# Patient Record
Sex: Female | Born: 1995
Health system: Southern US, Community
[De-identification: ages and names within clinical notes are randomized; demographics above are authoritative.]

## PROBLEM LIST (undated history)

## (undated) DIAGNOSIS — F319 Bipolar disorder, unspecified: Secondary | ICD-10-CM

## (undated) DIAGNOSIS — K5190 Ulcerative colitis, unspecified, without complications: Secondary | ICD-10-CM

## (undated) DIAGNOSIS — G43909 Migraine, unspecified, not intractable, without status migrainosus: Secondary | ICD-10-CM

## (undated) DIAGNOSIS — F419 Anxiety disorder, unspecified: Secondary | ICD-10-CM

## (undated) HISTORY — DX: Ulcerative colitis, unspecified, without complications: K51.90

## (undated) HISTORY — DX: Bipolar disorder, unspecified: F31.9

## (undated) HISTORY — DX: Anxiety disorder, unspecified: F41.9

---
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
## 2013-02-24 ENCOUNTER — Emergency Department (HOSPITAL_BASED_OUTPATIENT_CLINIC_OR_DEPARTMENT_OTHER): Payer: Managed Care, Other (non HMO)

## 2013-02-24 ENCOUNTER — Encounter (HOSPITAL_BASED_OUTPATIENT_CLINIC_OR_DEPARTMENT_OTHER): Payer: Self-pay

## 2013-02-24 ENCOUNTER — Emergency Department (HOSPITAL_BASED_OUTPATIENT_CLINIC_OR_DEPARTMENT_OTHER)
Admission: EM | Admit: 2013-02-24 | Discharge: 2013-02-25 | Disposition: A | Discharge status: Home / Self Care | Payer: Managed Care, Other (non HMO) | Attending: Emergency Medicine | Admitting: Emergency Medicine

## 2013-02-24 DIAGNOSIS — R5381 Other malaise: Secondary | ICD-10-CM | POA: Insufficient documentation

## 2013-02-24 DIAGNOSIS — F319 Bipolar disorder, unspecified: Secondary | ICD-10-CM | POA: Insufficient documentation

## 2013-02-24 DIAGNOSIS — Z87891 Personal history of nicotine dependence: Secondary | ICD-10-CM | POA: Insufficient documentation

## 2013-02-24 DIAGNOSIS — Z79899 Other long term (current) drug therapy: Secondary | ICD-10-CM | POA: Insufficient documentation

## 2013-02-24 DIAGNOSIS — F411 Generalized anxiety disorder: Secondary | ICD-10-CM | POA: Insufficient documentation

## 2013-02-24 DIAGNOSIS — R112 Nausea with vomiting, unspecified: Secondary | ICD-10-CM | POA: Insufficient documentation

## 2013-02-24 DIAGNOSIS — R509 Fever, unspecified: Secondary | ICD-10-CM | POA: Insufficient documentation

## 2013-02-24 DIAGNOSIS — Z8719 Personal history of other diseases of the digestive system: Secondary | ICD-10-CM | POA: Insufficient documentation

## 2013-02-24 DIAGNOSIS — Z3202 Encounter for pregnancy test, result negative: Secondary | ICD-10-CM | POA: Insufficient documentation

## 2013-02-24 DIAGNOSIS — R109 Unspecified abdominal pain: Secondary | ICD-10-CM | POA: Insufficient documentation

## 2013-02-24 LAB — CBC WITH DIFFERENTIAL/PLATELET
Basophils Absolute: 0 10*3/uL (ref 0.0–0.1)
Basophils Relative: 0 % (ref 0–1)
Eosinophils Absolute: 0 10*3/uL (ref 0.0–0.7)
MCH: 31 pg (ref 26.0–34.0)
MCHC: 34.3 g/dL (ref 30.0–36.0)
Monocytes Relative: 6 % (ref 3–12)
Neutro Abs: 5.9 10*3/uL (ref 1.7–7.7)
Neutrophils Relative %: 71 % (ref 43–77)
Platelets: 240 10*3/uL (ref 150–400)
RDW: 14.9 % (ref 11.5–15.5)

## 2013-02-24 LAB — URINE MICROSCOPIC-ADD ON

## 2013-02-24 LAB — COMPREHENSIVE METABOLIC PANEL
AST: 20 U/L (ref 0–37)
Albumin: 4.3 g/dL (ref 3.5–5.2)
Alkaline Phosphatase: 41 U/L (ref 39–117)
BUN: 13 mg/dL (ref 6–23)
Chloride: 104 mEq/L (ref 96–112)
Potassium: 3.6 mEq/L (ref 3.5–5.1)
Total Protein: 7.3 g/dL (ref 6.0–8.3)

## 2013-02-24 LAB — URINALYSIS, ROUTINE W REFLEX MICROSCOPIC
Bilirubin Urine: NEGATIVE
Glucose, UA: NEGATIVE mg/dL
Nitrite: NEGATIVE
Specific Gravity, Urine: 1.024 (ref 1.005–1.030)
pH: 7 (ref 5.0–8.0)

## 2013-02-24 LAB — LIPASE, BLOOD: Lipase: 41 U/L (ref 11–59)

## 2013-02-24 MED ORDER — SODIUM CHLORIDE 0.9 % IV SOLN
Freq: Once | INTRAVENOUS | Status: AC
  Administered 2013-02-24: 23:00:00 via INTRAVENOUS

## 2013-02-24 MED ORDER — IOHEXOL 300 MG/ML  SOLN
100.0000 mL | Freq: Once | INTRAMUSCULAR | Status: AC | PRN
  Administered 2013-02-24: 100 mL via INTRAVENOUS

## 2013-02-24 MED ORDER — HYDROMORPHONE HCL PF 1 MG/ML IJ SOLN
1.0000 mg | Freq: Once | INTRAMUSCULAR | Status: AC
  Administered 2013-02-24: 1 mg via INTRAVENOUS
  Filled 2013-02-24: qty 1

## 2013-02-24 MED ORDER — IOHEXOL 300 MG/ML  SOLN
50.0000 mL | Freq: Once | INTRAMUSCULAR | Status: AC | PRN
  Administered 2013-02-24: 50 mL via ORAL

## 2013-02-24 MED ORDER — MORPHINE SULFATE 4 MG/ML IJ SOLN
4.0000 mg | Freq: Once | INTRAMUSCULAR | Status: AC
  Administered 2013-02-24: 4 mg via INTRAVENOUS
  Filled 2013-02-24: qty 1

## 2013-02-24 MED ORDER — SODIUM CHLORIDE 0.9 % IV BOLUS (SEPSIS)
1000.0000 mL | Freq: Once | INTRAVENOUS | Status: AC
  Administered 2013-02-24: 1000 mL via INTRAVENOUS

## 2013-02-24 MED ORDER — ONDANSETRON HCL 4 MG/2ML IJ SOLN
4.0000 mg | INTRAMUSCULAR | Status: AC
  Administered 2013-02-24: 4 mg via INTRAVENOUS
  Filled 2013-02-24: qty 2

## 2013-02-24 MED ORDER — PROMETHAZINE HCL 25 MG/ML IJ SOLN
12.5000 mg | Freq: Once | INTRAMUSCULAR | Status: AC
  Administered 2013-02-24: 12.5 mg via INTRAVENOUS
  Filled 2013-02-24: qty 1

## 2013-02-24 NOTE — ED Provider Notes (Addendum)
Medical screening examination/treatment/procedure(s) were conducted as a shared visit with non-physician practitioner(s) and myself.  I personally evaluated the patient during the encounter Pt with hx of UC, recent colonscopy with worsening abd pain, vomiting.  Awaiting CT results.  Results for orders placed during the hospital encounter of 02/24/13  CBC WITH DIFFERENTIAL      Result Value Range   WBC 8.3  4.0 - 10.5 K/uL   RBC 4.81  3.87 - 5.11 MIL/uL   Hemoglobin 14.9  12.0 - 15.0 g/dL   HCT 43.4  36.0 - 46.0 %   MCV 90.2  78.0 - 100.0 fL   MCH 31.0  26.0 - 34.0 pg   MCHC 34.3  30.0 - 36.0 g/dL   RDW 14.9  11.5 - 15.5 %   Platelets 240  150 - 400 K/uL   Neutrophils Relative % 71  43 - 77 %   Neutro Abs 5.9  1.7 - 7.7 K/uL   Lymphocytes Relative 23  12 - 46 %   Lymphs Abs 1.9  0.7 - 4.0 K/uL   Monocytes Relative 6  3 - 12 %   Monocytes Absolute 0.5  0.1 - 1.0 K/uL   Eosinophils Relative 0  0 - 5 %   Eosinophils Absolute 0.0  0.0 - 0.7 K/uL   Basophils Relative 0  0 - 1 %   Basophils Absolute 0.0  0.0 - 0.1 K/uL  COMPREHENSIVE METABOLIC PANEL      Result Value Range   Sodium 141  135 - 145 mEq/L   Potassium 3.6  3.5 - 5.1 mEq/L   Chloride 104  96 - 112 mEq/L   CO2 25  19 - 32 mEq/L   Glucose, Bld 99  70 - 99 mg/dL   BUN 13  6 - 23 mg/dL   Creatinine, Ser 0.60  0.50 - 1.10 mg/dL   Calcium 10.1  8.4 - 10.5 mg/dL   Total Protein 7.3  6.0 - 8.3 g/dL   Albumin 4.3  3.5 - 5.2 g/dL   AST 20  0 - 37 U/L   ALT 10  0 - 35 U/L   Alkaline Phosphatase 41  39 - 117 U/L   Total Bilirubin 0.3  0.3 - 1.2 mg/dL   GFR calc non Af Amer >90  >90 mL/min   GFR calc Af Amer >90  >90 mL/min  LIPASE, BLOOD      Result Value Range   Lipase 41  11 - 59 U/L  URINALYSIS, ROUTINE W REFLEX MICROSCOPIC      Result Value Range   Color, Urine YELLOW  YELLOW   APPearance CLOUDY (*) CLEAR   Specific Gravity, Urine 1.024  1.005 - 1.030   pH 7.0  5.0 - 8.0   Glucose, UA NEGATIVE  NEGATIVE mg/dL   Hgb  urine dipstick NEGATIVE  NEGATIVE   Bilirubin Urine NEGATIVE  NEGATIVE   Ketones, ur 15 (*) NEGATIVE mg/dL   Protein, ur NEGATIVE  NEGATIVE mg/dL   Urobilinogen, UA 1.0  0.0 - 1.0 mg/dL   Nitrite NEGATIVE  NEGATIVE   Leukocytes, UA SMALL (*) NEGATIVE  PREGNANCY, URINE      Result Value Range   Preg Test, Ur NEGATIVE  NEGATIVE  URINE MICROSCOPIC-ADD ON      Result Value Range   Squamous Epithelial / LPF FEW (*) RARE   WBC, UA 3-6  <3 WBC/hpf   Bacteria, UA RARE  RARE   Ct Abdomen Pelvis W Contrast  02/25/2013   *  RADIOLOGY REPORT*  Clinical Data: 17 year old female with fever abdominal pain and vomiting.  Recent colonoscopy for ulcerative colitis.  CT ABDOMEN AND PELVIS WITH CONTRAST  Technique:  Multidetector CT imaging of the abdomen and pelvis was performed following the standard protocol during bolus administration of intravenous contrast.  Contrast: 151m OMNIPAQUE IOHEXOL 300 MG/ML  SOLN  Comparison: CT abdomen and pelvis 05/23/2012 and earlier.  Findings: Negative lung bases.  No pericardial or pleural effusion. No acute osseous abnormality identified.  Trace pelvic free fluid on the right.  Decompressed and negative distal colon.  Uterus and adnexa within normal limits.  The bladder is decompressed.  Negative left colon and splenic flexure.  Negative transverse colon.  Likewise the right colon and appendix appear normal. Terminal ileum within normal limits.  No dilated or abnormal small bowel identified.  Negative stomach and duodenum.  Negative liver, gallbladder, spleen, pancreas, adrenal glands, and kidneys.  Normal arterial structures in the abdomen and pelvis. Patent main portal vein.  No abdominal free fluid or lymphadenopathy.  IMPRESSION: Stable and essentially negative CT of the abdomen and pelvis. Trace pelvic free fluid on the right, likely physiologic.   Original Report Authenticated By: HRoselyn Reef M.D.   Dg Abd Acute W/chest  02/24/2013   CLINICAL DATA:  Fever. Cough. Nausea  and vomiting. Ulcerative colitis.  EXAM: ACUTE ABDOMEN SERIES (ABDOMEN 2 VIEW & CHEST 1 VIEW)  COMPARISON:  Chest radiograph on 06/03/2012  FINDINGS: No evidence of dilated bowel loops. No evidence of free air. No radiopaque calculi identified.  Heart size and mediastinal contours are normal. Both lungs are clear.  IMPRESSION: No acute findings.   Electronically Signed   By: JEarle Gell  On: 02/24/2013 20:39    Labs okay, CT without acute abnormality.  Pt feeling better.  [place] 02/[place]/13 [place] 02/25/13 [id number]

## 2013-02-24 NOTE — ED Notes (Signed)
Patient is drinking Oral Contrast for CT Scan.  2 hour contrast delay due to h/o ulcerative colitis.  Patient will be scanned at 11:20pm.

## 2013-02-24 NOTE — ED Notes (Signed)
Unable to provide urine specimen at triage

## 2013-02-24 NOTE — ED Provider Notes (Signed)
CSN: [id number]     Arrival date & time 02/24/13  1818 History     First MD Initiated Contact with Patient 02/24/13 1853     Chief Complaint  Patient presents with  . Emesis   (Consider location/radiation/quality/duration/timing/severity/associated sxs/prior Treatment) HPI Comments: Patient is a 17 y/o female with a hx of ulcerative colitis who presents for fever to Tmax of 10 88F, abdominal pain, and vomiting with onset 2 days ago. Patient states the symptoms have been constant since onset without any modifying factors. Pain is severe and cramping in nature as well as nonradiating. Patient endorses greater than 20 episodes of vomiting in the last 48 hours; last episode of emesis was 6 hours ago. Patient states that emesis has been nonbilious and nonbloody. She denies having a bowel movement or passing flatus since the symptoms began. Patient recently had colonoscopy performed 4 days ago for further evaluation of her ulcerative colitis. Patient states that procedure went well without complications; GI - Dr. Koleen Distance. Patient denies associated CP, SOB, dysuria, hematuria, vaginal complaints, and numbness/tingling in her extremities.  Patient is a 17 y.o. female presenting with vomiting. The history is provided by the patient. No language interpreter was used.  Emesis Associated symptoms: abdominal pain     Past Medical History  Diagnosis Date  . Anxiety   . Colitis, ulcerative 2012    ulceration of colon  . Bipolar disorder    Past Surgical History  Procedure Laterality Date  . Wisdom tooth extraction  2007   Family History  Problem Relation Age of Onset  . Alcohol abuse Paternal Grandfather    History  Substance Use Topics  . Smoking status: Former Smoker -- 0.10 packs/day for 2 years    Types: Cigarettes  . Smokeless tobacco: Never Used  . Alcohol Use: No     Comment: Last drink since two years ago   OB History   Grav Para Term Preterm Abortions TAB SAB Ect Mult Living                 Review of Systems  Constitutional: Positive for fever and fatigue.  Respiratory: Negative for shortness of breath.   Cardiovascular: Negative for chest pain.  Gastrointestinal: Positive for nausea, vomiting and abdominal pain. Negative for abdominal distention.  Genitourinary: Negative for dysuria and hematuria.  Skin: Negative for rash.  Neurological: Negative for numbness.  All other systems reviewed and are negative.   Allergies  Review of patient's allergies indicates no known allergies.  Home Medications   Current Outpatient Rx  Name  Route  Sig  Dispense  Refill  . levonorgestrel-ethinyl estradiol (ENPRESSE,TRIVORA) tablet   Oral   Take 1 tablet by mouth daily.         . QUEtiapine (SEROQUEL) 400 MG tablet   Oral   Take 400 mg by mouth at bedtime.         Marland Kitchen azaTHIOprine (IMURAN) 50 MG tablet   Oral   Take 50 mg by mouth at bedtime.          . clonazePAM (KLONOPIN) 0.5 MG tablet   Oral   Take 0.5 mg by mouth 3 (three) times daily as needed. For anxiety         . divalproex (DEPAKOTE) 500 MG DR tablet   Oral   Take 500 mg by mouth daily.         . fexofenadine (ALLEGRA) 180 MG tablet   Oral   Take 180 mg by mouth daily.         Marland Kitchen  hyoscyamine (LEVSIN, ANASPAZ) 0.125 MG tablet   Oral   Take 0.125 mg by mouth every 4 (four) hours as needed.         . lamoTRIgine (LAMICTAL) 200 MG tablet   Oral   Take 200 mg by mouth daily.         Marland Kitchen levonorgestrel-ethinyl estradiol (NORDETTE) 0.15-30 MG-MCG tablet   Oral   Take 1 tablet by mouth daily.         Marland Kitchen lurasidone (LATUDA) 80 MG TABS   Oral   Take by mouth daily with breakfast.         . nitrofurantoin, macrocrystal-monohydrate, (MACROBID) 100 MG capsule   Oral   Take 100 mg by mouth 2 (two) times daily. Last dose tuesday         . sulfaSALAzine (AZULFIDINE) 500 MG tablet   Oral   Take 500 mg by mouth 4 (four) times daily.          BP 103/54  Pulse 69  Temp(Src)  97.7 F (36.5 C) (Oral)  Resp 16  SpO2 100%  LMP 02/10/2013  Physical Exam  Nursing note and vitals reviewed. Constitutional: She is oriented to person, place, and time. She appears well-developed and well-nourished. No distress.  HENT:  Head: Normocephalic and atraumatic.  Eyes: Conjunctivae and EOM are normal. No scleral icterus.  Neck: Normal range of motion.  Cardiovascular: Regular rhythm, normal heart sounds and intact distal pulses.   Tachycardic to 103  Pulmonary/Chest: Effort normal and breath sounds normal. No respiratory distress. She has no wheezes. She has no rales.  Abdominal: Soft. She exhibits no distension and no mass. There is tenderness (Right and left lower quadrant tenderness to palpation). There is no rebound and no guarding.  Bowel sounds hypoactive. No peritoneal signs or evidence of acute surgical abdomen. Abdomen nondistended.  Musculoskeletal: Normal range of motion.  Neurological: She is alert and oriented to person, place, and time.  Skin: Skin is warm and dry. No rash noted. She is not diaphoretic. No erythema. There is pallor.  Psychiatric: She has a normal mood and affect. Her behavior is normal.   ED Course   Procedures (including critical care time)  Labs Reviewed  URINALYSIS, ROUTINE W REFLEX MICROSCOPIC - Abnormal; Notable for the following:    APPearance CLOUDY (*)    Ketones, ur 15 (*)    Leukocytes, UA SMALL (*)    All other components within normal limits  URINE MICROSCOPIC-ADD ON - Abnormal; Notable for the following:    Squamous Epithelial / LPF FEW (*)    All other components within normal limits  CBC WITH DIFFERENTIAL  COMPREHENSIVE METABOLIC PANEL  LIPASE, BLOOD  PREGNANCY, URINE   Dg Abd Acute W/chest  02/24/2013   CLINICAL DATA:  Fever. Cough. Nausea and vomiting. Ulcerative colitis.  EXAM: ACUTE ABDOMEN SERIES (ABDOMEN 2 VIEW & CHEST 1 VIEW)  COMPARISON:  Chest radiograph on 06/03/2012  FINDINGS: No evidence of dilated bowel  loops. No evidence of free air. No radiopaque calculi identified.  Heart size and mediastinal contours are normal. Both lungs are clear.  IMPRESSION: No acute findings.   Electronically Signed   By: Earle Gell   On: 02/24/2013 20:39   1. Abdominal pain 2. Nausea and Vomiting  MDM  17 year old female with a history of ulcerative colitis presents for emesis x2 days. Patient had colonoscopy performed 4 days ago. She denies passing flatus or having a bowel movement sense her colonoscopy. Patient without leukocytosis, anemia, hemoconcentration,  or electrolyte imbalance. Liver and kidney function preserved in your without evidence of infection. Urine pregnancy negative. DG acute abdomen with chest negative for fluid levels or signs of bowel obstruction. CT abdomen and pelvis with contrast ordered to further evaluate the patient's symptoms.  Patient signed out to Dr. Tamera Punt at end of shift for dispo with imaging pending.  Antonietta Breach, PA-C 02/24/13 2204

## 2013-02-24 NOTE — ED Notes (Signed)
Patient here with 3 days of general fatigue, chills, aching and vomiting. Patient reports that she has vomited greater than 20 times. Pale on arrival

## 2013-02-25 MED ORDER — ONDANSETRON 8 MG PO TBDP: ORAL_TABLET | ORAL | Status: DC

## 2013-02-25 MED ORDER — HYDROCODONE-ACETAMINOPHEN 5-325 MG PO TABS: 2.0000 | ORAL_TABLET | ORAL | Status: DC | PRN

## 2013-02-27 ENCOUNTER — Encounter (HOSPITAL_BASED_OUTPATIENT_CLINIC_OR_DEPARTMENT_OTHER): Payer: Self-pay | Admitting: *Deleted

## 2013-02-27 ENCOUNTER — Emergency Department (HOSPITAL_BASED_OUTPATIENT_CLINIC_OR_DEPARTMENT_OTHER)
Admission: EM | Admit: 2013-02-27 | Discharge: 2013-02-27 | Disposition: A | Discharge status: Home / Self Care | Payer: Managed Care, Other (non HMO) | Attending: Emergency Medicine | Admitting: Emergency Medicine

## 2013-02-27 DIAGNOSIS — Z79899 Other long term (current) drug therapy: Secondary | ICD-10-CM | POA: Insufficient documentation

## 2013-02-27 DIAGNOSIS — F319 Bipolar disorder, unspecified: Secondary | ICD-10-CM | POA: Insufficient documentation

## 2013-02-27 DIAGNOSIS — Z87891 Personal history of nicotine dependence: Secondary | ICD-10-CM | POA: Insufficient documentation

## 2013-02-27 DIAGNOSIS — R112 Nausea with vomiting, unspecified: Secondary | ICD-10-CM

## 2013-02-27 DIAGNOSIS — Z3202 Encounter for pregnancy test, result negative: Secondary | ICD-10-CM | POA: Insufficient documentation

## 2013-02-27 DIAGNOSIS — R1084 Generalized abdominal pain: Secondary | ICD-10-CM | POA: Insufficient documentation

## 2013-02-27 DIAGNOSIS — K5190 Ulcerative colitis, unspecified, without complications: Secondary | ICD-10-CM | POA: Insufficient documentation

## 2013-02-27 DIAGNOSIS — F411 Generalized anxiety disorder: Secondary | ICD-10-CM | POA: Insufficient documentation

## 2013-02-27 LAB — URINALYSIS, ROUTINE W REFLEX MICROSCOPIC
Bilirubin Urine: NEGATIVE
Glucose, UA: NEGATIVE mg/dL
Ketones, ur: 15 mg/dL — AB
Protein, ur: NEGATIVE mg/dL
Urobilinogen, UA: 1 mg/dL (ref 0.0–1.0)

## 2013-02-27 LAB — CBC WITH DIFFERENTIAL/PLATELET
Basophils Absolute: 0 10*3/uL (ref 0.0–0.1)
Basophils Relative: 0 % (ref 0–1)
Eosinophils Relative: 2 % (ref 0–5)
HCT: 37.8 % (ref 36.0–46.0)
Hemoglobin: 12.9 g/dL (ref 12.0–15.0)
MCH: 31.2 pg (ref 26.0–34.0)
MCHC: 34.1 g/dL (ref 30.0–36.0)
MCV: 91.5 fL (ref 78.0–100.0)
Monocytes Absolute: 0.4 10*3/uL (ref 0.1–1.0)
Monocytes Relative: 7 % (ref 3–12)
RDW: 14.8 % (ref 11.5–15.5)

## 2013-02-27 LAB — COMPREHENSIVE METABOLIC PANEL
AST: 19 U/L (ref 0–37)
Albumin: 3.9 g/dL (ref 3.5–5.2)
BUN: 10 mg/dL (ref 6–23)
Calcium: 9.4 mg/dL (ref 8.4–10.5)
Chloride: 105 mEq/L (ref 96–112)
Creatinine, Ser: 0.8 mg/dL (ref 0.50–1.10)
GFR calc non Af Amer: >90 mL/min (ref >90)
Total Bilirubin: 0.4 mg/dL (ref 0.3–1.2)

## 2013-02-27 LAB — URINE MICROSCOPIC-ADD ON

## 2013-02-27 LAB — PREGNANCY, URINE: Preg Test, Ur: NEGATIVE

## 2013-02-27 MED ORDER — PROMETHAZINE HCL 25 MG RE SUPP: 25.0000 mg | Freq: Four times a day (QID) | RECTAL | Status: DC | PRN

## 2013-02-27 MED ORDER — CEPHALEXIN 250 MG PO CAPS
500.0000 mg | ORAL_CAPSULE | Freq: Once | ORAL | Status: AC
  Administered 2013-02-27: 500 mg via ORAL
  Filled 2013-02-27: qty 2

## 2013-02-27 MED ORDER — PROMETHAZINE HCL 25 MG PO TABS: 25.0000 mg | ORAL_TABLET | Freq: Four times a day (QID) | ORAL | Status: DC | PRN

## 2013-02-27 MED ORDER — PROMETHAZINE HCL 25 MG/ML IJ SOLN
12.5000 mg | Freq: Once | INTRAMUSCULAR | Status: AC
  Administered 2013-02-27: 12.5 mg via INTRAVENOUS
  Filled 2013-02-27: qty 1

## 2013-02-27 MED ORDER — KETOROLAC TROMETHAMINE 30 MG/ML IJ SOLN
30.0000 mg | Freq: Once | INTRAMUSCULAR | Status: AC
  Administered 2013-02-27: 30 mg via INTRAVENOUS
  Filled 2013-02-27: qty 1

## 2013-02-27 MED ORDER — CEPHALEXIN 500 MG PO CAPS: 500.0000 mg | ORAL_CAPSULE | Freq: Four times a day (QID) | ORAL | Status: DC

## 2013-02-27 MED ORDER — SODIUM CHLORIDE 0.9 % IV BOLUS (SEPSIS)
1000.0000 mL | Freq: Once | INTRAVENOUS | Status: AC
  Administered 2013-02-27: 1000 mL via INTRAVENOUS

## 2013-02-27 NOTE — ED Notes (Signed)
Pt informed that we still need urine sample. Pt refuses catheter, states she will let staff know as soon as she is able to void.

## 2013-02-27 NOTE — ED Provider Notes (Addendum)
8:44 AM Patient feels much better at this time.  Urinalysis has some bacteria and white blood cells.  May represent early urinary tract infection.  Urine culture sent.  Patient be started on Keflex..  Labs are normal.  Vital signs are without significant abnormality.  Patient feels much better after IV Phenergan.  Repeat abdominal exam is benign.  PCP followup.  She understands return to ER for new or worsening symptoms  Ct Abdomen Pelvis W Contrast  02/25/2013   *RADIOLOGY REPORT*  Clinical Data: 17 year old female with fever abdominal pain and vomiting.  Recent colonoscopy for ulcerative colitis.  CT ABDOMEN AND PELVIS WITH CONTRAST  Technique:  Multidetector CT imaging of the abdomen and pelvis was performed following the standard protocol during bolus administration of intravenous contrast.  Contrast: 15m OMNIPAQUE IOHEXOL 300 MG/ML  SOLN  Comparison: CT abdomen and pelvis 05/23/2012 and earlier.  Findings: Negative lung bases.  No pericardial or pleural effusion. No acute osseous abnormality identified.  Trace pelvic free fluid on the right.  Decompressed and negative distal colon.  Uterus and adnexa within normal limits.  The bladder is decompressed.  Negative left colon and splenic flexure.  Negative transverse colon.  Likewise the right colon and appendix appear normal. Terminal ileum within normal limits.  No dilated or abnormal small bowel identified.  Negative stomach and duodenum.  Negative liver, gallbladder, spleen, pancreas, adrenal glands, and kidneys.  Normal arterial structures in the abdomen and pelvis. Patent main portal vein.  No abdominal free fluid or lymphadenopathy.  IMPRESSION: Stable and essentially negative CT of the abdomen and pelvis. Trace pelvic free fluid on the right, likely physiologic.   Original Report Authenticated By: HRoselyn Reef M.D.   Dg Abd Acute W/chest  02/24/2013   CLINICAL DATA:  Fever. Cough. Nausea and vomiting. Ulcerative colitis.  EXAM: ACUTE ABDOMEN SERIES  (ABDOMEN 2 VIEW & CHEST 1 VIEW)  COMPARISON:  Chest radiograph on 06/03/2012  FINDINGS: No evidence of dilated bowel loops. No evidence of free air. No radiopaque calculi identified.  Heart size and mediastinal contours are normal. Both lungs are clear.  IMPRESSION: No acute findings.   Electronically Signed   By: JEarle Gell  On: 02/24/2013 20:39   Results for orders placed during the hospital encounter of 02/27/13  URINALYSIS, ROUTINE W REFLEX MICROSCOPIC      Result Value Range   Color, Urine AMBER (*) YELLOW   APPearance CLEAR  CLEAR   Specific Gravity, Urine 1.026  1.005 - 1.030   pH 6.5  5.0 - 8.0   Glucose, UA NEGATIVE  NEGATIVE mg/dL   Hgb urine dipstick LARGE (*) NEGATIVE   Bilirubin Urine NEGATIVE  NEGATIVE   Ketones, ur 15 (*) NEGATIVE mg/dL   Protein, ur NEGATIVE  NEGATIVE mg/dL   Urobilinogen, UA 1.0  0.0 - 1.0 mg/dL   Nitrite NEGATIVE  NEGATIVE   Leukocytes, UA SMALL (*) NEGATIVE  PREGNANCY, URINE      Result Value Range   Preg Test, Ur NEGATIVE  NEGATIVE  CBC WITH DIFFERENTIAL      Result Value Range   WBC 6.0  4.0 - 10.5 K/uL   RBC 4.13  3.87 - 5.11 MIL/uL   Hemoglobin 12.9  12.0 - 15.0 g/dL   HCT 37.8  36.0 - 46.0 %   MCV 91.5  78.0 - 100.0 fL   MCH 31.2  26.0 - 34.0 pg   MCHC 34.1  30.0 - 36.0 g/dL   RDW 14.8  11.5 -  15.5 %   Platelets 225  150 - 400 K/uL   Neutrophils Relative % 51  43 - 77 %   Neutro Abs 3.1  1.7 - 7.7 K/uL   Lymphocytes Relative 40  12 - 46 %   Lymphs Abs 2.4  0.7 - 4.0 K/uL   Monocytes Relative 7  3 - 12 %   Monocytes Absolute 0.4  0.1 - 1.0 K/uL   Eosinophils Relative 2  0 - 5 %   Eosinophils Absolute 0.1  0.0 - 0.7 K/uL   Basophils Relative 0  0 - 1 %   Basophils Absolute 0.0  0.0 - 0.1 K/uL  COMPREHENSIVE METABOLIC PANEL      Result Value Range   Sodium 141  135 - 145 mEq/L   Potassium 3.4 (*) 3.5 - 5.1 mEq/L   Chloride 105  96 - 112 mEq/L   CO2 25  19 - 32 mEq/L   Glucose, Bld 88  70 - 99 mg/dL   BUN 10  6 - 23 mg/dL    Creatinine, Ser 0.80  0.50 - 1.10 mg/dL   Calcium 9.4  8.4 - 10.5 mg/dL   Total Protein 6.3  6.0 - 8.3 g/dL   Albumin 3.9  3.5 - 5.2 g/dL   AST 19  0 - 37 U/L   ALT 7  0 - 35 U/L   Alkaline Phosphatase 36 (*) 39 - 117 U/L   Total Bilirubin 0.4  0.3 - 1.2 mg/dL   GFR calc non Af Amer >90  >90 mL/min   GFR calc Af Amer >90  >90 mL/min  URINE MICROSCOPIC-ADD ON      Result Value Range   Squamous Epithelial / LPF RARE  RARE   WBC, UA 3-6  <3 WBC/hpf   RBC / HPF 0-2  <3 RBC/hpf   Bacteria, UA MANY (*) RARE   Urine-Other MUCOUS [place], MD 02/27/13 [place], MD 02/27/13 [phone]

## 2013-02-27 NOTE — ED Provider Notes (Signed)
CSN: [id number]     Arrival date & time 02/27/13  0606 History     None    Chief Complaint  Patient presents with  . nausea/vomiting    (Consider location/radiation/quality/duration/timing/severity/associated sxs/prior Treatment) HPI Comments: Patient is a 17 year old female with past medical history significant for ulcerative colitis. She presents today with complaints of generalized abdominal cramping, nausea, and vomiting that has been occurring for the past several days. She denies diarrhea and denies any bloody stool. She was seen here 3 days ago and had workup performed including laboratory studies, urinalysis, and CAT scan. These were overall unremarkable. She was sent home with Zofran however she continues to vomit despite this medication. She states that she is feeling no better. She has felt fevered at home but denies chills.  She is followed by Dr. Koleen Distance, gastroenterologist from [facility]. She also tells me that she had a colonoscopy performed less than 3 weeks ago which was unremarkable.  The history is provided by the patient.    Past Medical History  Diagnosis Date  . Anxiety   . Colitis, ulcerative 2012    ulceration of colon  . Bipolar disorder    Past Surgical History  Procedure Laterality Date  . Wisdom tooth extraction  2007   Family History  Problem Relation Age of Onset  . Alcohol abuse Paternal Grandfather    History  Substance Use Topics  . Smoking status: Former Smoker -- 0.10 packs/day for 2 years    Types: Cigarettes  . Smokeless tobacco: Never Used  . Alcohol Use: No     Comment: Last drink since two years ago   OB History   Grav Para Term Preterm Abortions TAB SAB Ect Mult Living                 Review of Systems  All other systems reviewed and are negative.    Allergies  Review of patient's allergies indicates no known allergies.  Home Medications   Current Outpatient Rx  Name  Route  Sig  Dispense  Refill  . azaTHIOprine  (IMURAN) 50 MG tablet   Oral   Take 50 mg by mouth at bedtime.          . clonazePAM (KLONOPIN) 0.5 MG tablet   Oral   Take 0.5 mg by mouth 3 (three) times daily as needed. For anxiety         . divalproex (DEPAKOTE) 500 MG DR tablet   Oral   Take 500 mg by mouth daily.         . fexofenadine (ALLEGRA) 180 MG tablet   Oral   Take 180 mg by mouth daily.         Marland Kitchen HYDROcodone-acetaminophen (NORCO/VICODIN) 5-325 MG per tablet   Oral   Take 2 tablets by mouth every 4 (four) hours as needed for pain.   15 tablet   0   . hyoscyamine (LEVSIN, ANASPAZ) 0.125 MG tablet   Oral   Take 0.125 mg by mouth every 4 (four) hours as needed.         . lamoTRIgine (LAMICTAL) 200 MG tablet   Oral   Take 200 mg by mouth daily.         Marland Kitchen levonorgestrel-ethinyl estradiol (ENPRESSE,TRIVORA) tablet   Oral   Take 1 tablet by mouth daily.         Marland Kitchen levonorgestrel-ethinyl estradiol (NORDETTE) 0.15-30 MG-MCG tablet   Oral   Take 1 tablet by mouth daily.         Marland Kitchen  lurasidone (LATUDA) 80 MG TABS   Oral   Take by mouth daily with breakfast.         . nitrofurantoin, macrocrystal-monohydrate, (MACROBID) 100 MG capsule   Oral   Take 100 mg by mouth 2 (two) times daily. Last dose tuesday         . ondansetron (ZOFRAN ODT) 8 MG disintegrating tablet      86m ODT q4 hours prn nausea   10 tablet   0   . QUEtiapine (SEROQUEL) 400 MG tablet   Oral   Take 400 mg by mouth at bedtime.         . sulfaSALAzine (AZULFIDINE) 500 MG tablet   Oral   Take 500 mg by mouth 4 (four) times daily.          BP 115/67  Pulse 95  Temp(Src) 98 F (36.7 C) (Oral)  Resp 20  Ht 5' 5"  (1.651 m)  Wt 130 lb (58.968 kg)  BMI 21.63 kg/m2  SpO2 98%  LMP 02/10/2013 Physical Exam  Nursing note and vitals reviewed. Constitutional: She is oriented to person, place, and time. She appears well-developed and well-nourished. No distress.  Patient appears well. She does not appear clinically  dehydrated and is not actively vomiting.  HENT:  Head: Normocephalic and atraumatic.  Mouth/Throat: Oropharynx is clear and moist.  Neck: Normal range of motion. Neck supple.  Cardiovascular: Normal rate, regular rhythm and normal heart sounds.   No murmur heard. Pulmonary/Chest: Effort normal and breath sounds normal. No respiratory distress.  Abdominal: Soft. Bowel sounds are normal.  There is mild generalized tenderness to palpation with no rebound or guarding.  Musculoskeletal: Normal range of motion. She exhibits no edema.  Neurological: She is alert and oriented to person, place, and time.  Skin: Skin is warm and dry. She is not diaphoretic.    ED Course   Procedures (including critical care time)  Labs Reviewed  URINALYSIS, ROUTINE W REFLEX MICROSCOPIC  PREGNANCY, URINE  CBC WITH DIFFERENTIAL  COMPREHENSIVE METABOLIC PANEL   No results found. No diagnosis found.  MDM  Patient presents with complaints of n/v for the past several days.  She appears clinically well and vitals are stable.  Patient receiving ivf's and meds, labs pending.  Care signed out to Dr. CVenora Maplesat shift change.  DVeryl Speak MD 03/03/13 [phone]

## 2013-02-27 NOTE — ED Notes (Signed)
Pt is unable to urinate at present. Aware that we need a urine sample.

## 2013-02-27 NOTE — ED Notes (Signed)
C/o n/v for since Wednesday and was seen here on Friday for same symptoms but pt states n/v not improving. Hx of ulcerative colitis. Fevers unknown, but states chills.  C/o lower abd pain. Describes as constant and describes as cramping. Denies any diarrhea. Taking zofran at home but states it is not helping. Last dose was at 6pm last night. C/o of general h/a as well. States not able to keep fluids down. States she had a recent colonoscopy and states results were "fine"

## 2013-02-28 LAB — URINE CULTURE

## 2013-06-05 ENCOUNTER — Emergency Department (HOSPITAL_BASED_OUTPATIENT_CLINIC_OR_DEPARTMENT_OTHER)
Admission: EM | Admit: 2013-06-05 | Discharge: 2013-06-05 | Disposition: A | Discharge status: Home / Self Care | Payer: Managed Care, Other (non HMO) | Attending: Emergency Medicine | Admitting: Emergency Medicine

## 2013-06-05 ENCOUNTER — Encounter (HOSPITAL_BASED_OUTPATIENT_CLINIC_OR_DEPARTMENT_OTHER): Payer: Self-pay | Admitting: Emergency Medicine

## 2013-06-05 DIAGNOSIS — R630 Anorexia: Secondary | ICD-10-CM | POA: Insufficient documentation

## 2013-06-05 DIAGNOSIS — F319 Bipolar disorder, unspecified: Secondary | ICD-10-CM | POA: Insufficient documentation

## 2013-06-05 DIAGNOSIS — Z792 Long term (current) use of antibiotics: Secondary | ICD-10-CM | POA: Insufficient documentation

## 2013-06-05 DIAGNOSIS — Z79899 Other long term (current) drug therapy: Secondary | ICD-10-CM | POA: Insufficient documentation

## 2013-06-05 DIAGNOSIS — Z8711 Personal history of peptic ulcer disease: Secondary | ICD-10-CM | POA: Insufficient documentation

## 2013-06-05 DIAGNOSIS — Z87891 Personal history of nicotine dependence: Secondary | ICD-10-CM | POA: Insufficient documentation

## 2013-06-05 DIAGNOSIS — F411 Generalized anxiety disorder: Secondary | ICD-10-CM | POA: Insufficient documentation

## 2013-06-05 DIAGNOSIS — R1084 Generalized abdominal pain: Secondary | ICD-10-CM

## 2013-06-05 DIAGNOSIS — Z3202 Encounter for pregnancy test, result negative: Secondary | ICD-10-CM | POA: Insufficient documentation

## 2013-06-05 DIAGNOSIS — R112 Nausea with vomiting, unspecified: Secondary | ICD-10-CM | POA: Insufficient documentation

## 2013-06-05 LAB — URINE MICROSCOPIC-ADD ON

## 2013-06-05 LAB — COMPREHENSIVE METABOLIC PANEL
ALT: 8 U/L (ref 0–35)
Alkaline Phosphatase: 44 U/L (ref 39–117)
BUN: 11 mg/dL (ref 6–23)
CO2: 24 mEq/L (ref 19–32)
Chloride: 104 mEq/L (ref 96–112)
GFR calc Af Amer: >90 mL/min (ref >90)
GFR calc non Af Amer: >90 mL/min (ref >90)
Glucose, Bld: 92 mg/dL (ref 70–99)
Potassium: 3.9 mEq/L (ref 3.5–5.1)
Sodium: 140 mEq/L (ref 135–145)
Total Bilirubin: 0.4 mg/dL (ref 0.3–1.2)

## 2013-06-05 LAB — CBC WITH DIFFERENTIAL/PLATELET
Hemoglobin: 14.5 g/dL (ref 12.0–15.0)
Lymphocytes Relative: 28 % (ref 12–46)
Lymphs Abs: 1.8 10*3/uL (ref 0.7–4.0)
Monocytes Relative: 6 % (ref 3–12)
Neutrophils Relative %: 66 % (ref 43–77)
Platelets: 213 10*3/uL (ref 150–400)
RBC: 4.44 MIL/uL (ref 3.87–5.11)
WBC: 6.5 10*3/uL (ref 4.0–10.5)

## 2013-06-05 LAB — URINALYSIS, ROUTINE W REFLEX MICROSCOPIC
Bilirubin Urine: NEGATIVE
Ketones, ur: 15 mg/dL — AB
Protein, ur: NEGATIVE mg/dL
Specific Gravity, Urine: 1.022 (ref 1.005–1.030)
Urobilinogen, UA: 0.2 mg/dL (ref 0.0–1.0)

## 2013-06-05 MED ORDER — PROMETHAZINE HCL 25 MG/ML IJ SOLN
25.0000 mg | Freq: Once | INTRAMUSCULAR | Status: AC
  Administered 2013-06-05: 25 mg via INTRAVENOUS
  Filled 2013-06-05: qty 1

## 2013-06-05 MED ORDER — SODIUM CHLORIDE 0.9 % IV BOLUS (SEPSIS)
1000.0000 mL | Freq: Once | INTRAVENOUS | Status: AC
  Administered 2013-06-05: 1000 mL via INTRAVENOUS

## 2013-06-05 MED ORDER — HYDROCODONE-ACETAMINOPHEN 5-325 MG PO TABS: 2.0000 | ORAL_TABLET | ORAL | Status: DC | PRN

## 2013-06-05 MED ORDER — MORPHINE SULFATE 4 MG/ML IJ SOLN
4.0000 mg | Freq: Once | INTRAMUSCULAR | Status: AC
  Administered 2013-06-05: 4 mg via INTRAVENOUS
  Filled 2013-06-05: qty 1

## 2013-06-05 NOTE — ED Provider Notes (Signed)
CSN: [id number]     Arrival date & time 06/05/13  0704 History   First MD Initiated Contact with Patient 06/05/13 [phone]     Chief Complaint  Patient presents with  . Abdominal Pain  . Nausea   (Consider location/radiation/quality/duration/timing/severity/associated sxs/prior Treatment) Patient is a 17 y.o. female presenting with abdominal pain. The history is provided by the patient and a friend.  Abdominal Pain Pain location:  Generalized Pain quality: aching   Pain radiates to:  Back Pain severity:  Severe Onset quality:  Gradual Duration:  4 days Timing:  Constant Progression:  Waxing and waning Chronicity:  Recurrent Context: diet changes and suspicious food intake   Context: not previous surgeries, not recent illness, not recent travel, not sick contacts and not trauma   Context comment:  Had red meat & pizza which she normally doesn't eat Relieved by:  Nothing Worsened by:  Nothing tried Ineffective treatments:  None tried Associated symptoms: anorexia, nausea and vomiting   Associated symptoms: no chest pain, no chills, no cough, no diarrhea, no dysuria, no fatigue, no fever, no shortness of breath, no sore throat, no vaginal bleeding and no vaginal discharge   Nausea:    Severity:  Severe   Onset quality:  Gradual   Duration:  4 days   Timing:  Constant Vomiting:    Quality:  Bilious material   Number of occurrences:  Approx 10   Severity:  Moderate   Duration:  1 day   Timing:  Constant   Progression:  Unchanged Risk factors: has not had multiple surgeries, no NSAID use, not pregnant and no recent hospitalization     Past Medical History  Diagnosis Date  . Anxiety   . Colitis, ulcerative 2012    ulceration of colon  . Bipolar disorder    Past Surgical History  Procedure Laterality Date  . Wisdom tooth extraction  2007   Family History  Problem Relation Age of Onset  . Alcohol abuse Paternal Grandfather    History  Substance Use Topics  . Smoking  status: Former Smoker -- 0.10 packs/day for 2 years    Types: Cigarettes  . Smokeless tobacco: Never Used  . Alcohol Use: No     Comment: Last drink since two years ago   OB History   Grav Para Term Preterm Abortions TAB SAB Ect Mult Living                 Review of Systems  Constitutional: Negative for fever, chills, diaphoresis, activity change, appetite change and fatigue.  HENT: Negative for congestion, facial swelling, rhinorrhea and sore throat.   Eyes: Negative for photophobia and discharge.  Respiratory: Negative for cough, chest tightness and shortness of breath.   Cardiovascular: Negative for chest pain, palpitations and leg swelling.  Gastrointestinal: Positive for nausea, vomiting, abdominal pain and anorexia. Negative for diarrhea.  Endocrine: Negative for polydipsia and polyuria.  Genitourinary: Negative for dysuria, frequency, vaginal bleeding, vaginal discharge, difficulty urinating and pelvic pain.  Musculoskeletal: Negative for arthralgias, back pain, neck pain and neck stiffness.  Skin: Negative for color change and wound.  Allergic/Immunologic: Negative for immunocompromised state.  Neurological: Negative for facial asymmetry, weakness, numbness and headaches.  Hematological: Does not bruise/bleed easily.  Psychiatric/Behavioral: Negative for confusion and agitation.    Allergies  Dilaudid  Home Medications   Current Outpatient Rx  Name  Route  Sig  Dispense  Refill  . folic acid (FOLVITE) 1 MG tablet   Oral   Take  1 mg by mouth daily.         Marland Kitchen azaTHIOprine (IMURAN) 50 MG tablet   Oral   Take 50 mg by mouth at bedtime.          . cephALEXin (KEFLEX) 500 MG capsule   Oral   Take 1 capsule (500 mg total) by mouth 4 (four) times daily.   20 capsule   0   . clonazePAM (KLONOPIN) 0.5 MG tablet   Oral   Take 1 mg by mouth 3 (three) times daily as needed. For anxiety         . divalproex (DEPAKOTE) 500 MG DR tablet   Oral   Take 500 mg by  mouth daily.         . fexofenadine (ALLEGRA) 180 MG tablet   Oral   Take 180 mg by mouth daily.         Marland Kitchen HYDROcodone-acetaminophen (NORCO) 5-325 MG per tablet   Oral   Take 2 tablets by mouth every 4 (four) hours as needed.   10 tablet   0   . HYDROcodone-acetaminophen (NORCO/VICODIN) 5-325 MG per tablet   Oral   Take 2 tablets by mouth every 4 (four) hours as needed for pain.   15 tablet   0   . hyoscyamine (LEVSIN, ANASPAZ) 0.125 MG tablet   Oral   Take 0.125 mg by mouth every 4 (four) hours as needed.         . lamoTRIgine (LAMICTAL) 200 MG tablet   Oral   Take 200 mg by mouth daily.         Marland Kitchen levonorgestrel-ethinyl estradiol (ENPRESSE,TRIVORA) tablet   Oral   Take 1 tablet by mouth daily.         Marland Kitchen levonorgestrel-ethinyl estradiol (NORDETTE) 0.15-30 MG-MCG tablet   Oral   Take 1 tablet by mouth daily.         Marland Kitchen lurasidone (LATUDA) 80 MG TABS   Oral   Take by mouth daily with breakfast.         . nitrofurantoin, macrocrystal-monohydrate, (MACROBID) 100 MG capsule   Oral   Take 100 mg by mouth 2 (two) times daily. Last dose tuesday         . ondansetron (ZOFRAN ODT) 8 MG disintegrating tablet      53m ODT q4 hours prn nausea   10 tablet   0   . promethazine (PHENERGAN) 25 MG suppository   Rectal   Place 1 suppository (25 mg total) rectally every 6 (six) hours as needed for nausea.   12 each   0   . promethazine (PHENERGAN) 25 MG tablet   Oral   Take 1 tablet (25 mg total) by mouth every 6 (six) hours as needed for nausea.   15 tablet   0   . QUEtiapine (SEROQUEL) 400 MG tablet   Oral   Take 400 mg by mouth at bedtime.         . sulfaSALAzine (AZULFIDINE) 500 MG tablet   Oral   Take 500 mg by mouth 4 (four) times daily.          BP 108/80  Pulse 104  Temp(Src) 98 F (36.7 C) (Oral)  Resp 16  Ht 5' 5"  (1.651 m)  Wt 140 lb (63.504 kg)  BMI 23.30 kg/m2  SpO2 100%  LMP 05/13/2013 Physical Exam  Constitutional: She is  oriented to person, place, and time. She appears well-developed and well-nourished. No distress.  HENT:  Head:  Normocephalic and atraumatic.  Mouth/Throat: No oropharyngeal exudate.  Eyes: Pupils are equal, round, and reactive to light.  Neck: Normal range of motion. Neck supple.  Cardiovascular: Normal rate, regular rhythm and normal heart sounds.  Exam reveals no gallop and no friction rub.   No murmur heard. Pulmonary/Chest: Effort normal and breath sounds normal. No respiratory distress. She has no wheezes. She has no rales.  Abdominal: Soft. Bowel sounds are normal. She exhibits no distension and no mass. There is tenderness (generalized). There is no rigidity, no rebound and no guarding.  Musculoskeletal: Normal range of motion. She exhibits no edema and no tenderness.  Neurological: She is alert and oriented to person, place, and time.  Skin: Skin is warm and dry.  Psychiatric: She has a normal mood and affect.    ED Course  Procedures (including critical care time) Labs Review Labs Reviewed  URINALYSIS, ROUTINE W REFLEX MICROSCOPIC - Abnormal; Notable for the following:    Hgb urine dipstick TRACE (*)    Ketones, ur 15 (*)    All other components within normal limits  URINE MICROSCOPIC-ADD ON - Abnormal; Notable for the following:    Bacteria, UA FEW (*)    All other components within normal limits  CBC WITH DIFFERENTIAL  COMPREHENSIVE METABOLIC PANEL  LIPASE, BLOOD  PREGNANCY, URINE   Imaging Review No results found.  EKG Interpretation   None       MDM   1. Generalized abdominal pain    Pt is a 17 y.o. female with Pmhx as above including UC who presents with 4 days of generalized abdominal pain, and about 24 hours of n/v.  No fever, urinary symptoms, vag bleeding or d/c, d/a or rectal bleeding. Pt states symptoms similar to multiple prior episodes in the past thought to be due to flare of UC symptoms which were improved w/ IVF, phenergan, pain meds.  Pt states  symptoms may have been exacerbated by eating red meat & pizza which she normally doesn't have.  On PE, HR slightly elevated at 104, VS otherwise stable. Pt has generalized abdominal tenderness w/o rebound or guarding, nml bowel sounds.  She had similar symptoms 8/12, 8/15 with nml CT scan.  Given symptoms unchanged from multiple prior visits, I do not feel imaging will be helpful at this point. CBC, CMP, lipase, UA, POC preg ordered.  Will treat symptomatically.  9:19 AM Symptoms resolved after IVF, phenergan 1 dose of morphine.  Labs & urine unremarkable.  I feel pt safe for d/c.  She will f/u with her GI specialist at [facility].  Return precautions given for new or worsening symptoms including worsening pain, fever, uncontrolled vomiting.        Neta Ehlers, MD 06/05/13 [phone]

## 2013-06-05 NOTE — ED Notes (Signed)
Pt awake and alert, reports she forgot to take her medicine since Friday, now has "upset stomach" and nausea. Denies any diarrhea or fevers.

## 2013-07-09 ENCOUNTER — Emergency Department (HOSPITAL_BASED_OUTPATIENT_CLINIC_OR_DEPARTMENT_OTHER)
Admission: EM | Admit: 2013-07-09 | Discharge: 2013-07-09 | Disposition: A | Discharge status: Home / Self Care | Payer: Managed Care, Other (non HMO) | Attending: Emergency Medicine | Admitting: Emergency Medicine

## 2013-07-09 ENCOUNTER — Encounter (HOSPITAL_BASED_OUTPATIENT_CLINIC_OR_DEPARTMENT_OTHER): Payer: Self-pay | Admitting: Emergency Medicine

## 2013-07-09 DIAGNOSIS — IMO0002 Reserved for concepts with insufficient information to code with codable children: Secondary | ICD-10-CM | POA: Insufficient documentation

## 2013-07-09 DIAGNOSIS — Z792 Long term (current) use of antibiotics: Secondary | ICD-10-CM | POA: Insufficient documentation

## 2013-07-09 DIAGNOSIS — K5190 Ulcerative colitis, unspecified, without complications: Secondary | ICD-10-CM | POA: Insufficient documentation

## 2013-07-09 DIAGNOSIS — Z87891 Personal history of nicotine dependence: Secondary | ICD-10-CM | POA: Insufficient documentation

## 2013-07-09 DIAGNOSIS — Z3202 Encounter for pregnancy test, result negative: Secondary | ICD-10-CM | POA: Insufficient documentation

## 2013-07-09 DIAGNOSIS — F319 Bipolar disorder, unspecified: Secondary | ICD-10-CM | POA: Insufficient documentation

## 2013-07-09 DIAGNOSIS — Z79899 Other long term (current) drug therapy: Secondary | ICD-10-CM | POA: Insufficient documentation

## 2013-07-09 DIAGNOSIS — F411 Generalized anxiety disorder: Secondary | ICD-10-CM | POA: Insufficient documentation

## 2013-07-09 LAB — CBC WITH DIFFERENTIAL/PLATELET
Basophils Absolute: 0 10*3/uL (ref 0.0–0.1)
HCT: 42.7 % (ref 36.0–46.0)
Lymphocytes Relative: 32 % (ref 12–46)
Monocytes Absolute: 0.4 10*3/uL (ref 0.1–1.0)
Neutro Abs: 3.4 10*3/uL (ref 1.7–7.7)
Platelets: 224 10*3/uL (ref 150–400)
RBC: 4.41 MIL/uL (ref 3.87–5.11)
RDW: 13.5 % (ref 11.5–15.5)
WBC: 5.6 10*3/uL (ref 4.0–10.5)

## 2013-07-09 LAB — COMPREHENSIVE METABOLIC PANEL
BUN: 11 mg/dL (ref 6–23)
Calcium: 9 mg/dL (ref 8.4–10.5)
GFR calc Af Amer: >90 mL/min (ref >90)
Glucose, Bld: 97 mg/dL (ref 70–99)
Total Protein: 6.7 g/dL (ref 6.0–8.3)

## 2013-07-09 LAB — URINALYSIS, ROUTINE W REFLEX MICROSCOPIC
Leukocytes, UA: NEGATIVE
Nitrite: NEGATIVE
Specific Gravity, Urine: 1.02 (ref 1.005–1.030)
pH: 7 (ref 5.0–8.0)

## 2013-07-09 LAB — URINE MICROSCOPIC-ADD ON

## 2013-07-09 MED ORDER — MORPHINE SULFATE 4 MG/ML IJ SOLN
4.0000 mg | INTRAMUSCULAR | Status: DC | PRN
  Administered 2013-07-09: 4 mg via INTRAVENOUS
  Filled 2013-07-09: qty 1

## 2013-07-09 MED ORDER — ONDANSETRON HCL 4 MG/2ML IJ SOLN: 4.0000 mg | Freq: Once | INTRAMUSCULAR | Status: AC

## 2013-07-09 MED ORDER — SODIUM CHLORIDE 0.9 % IV BOLUS (SEPSIS)
1000.0000 mL | Freq: Once | INTRAVENOUS | Status: AC
  Administered 2013-07-09: 1000 mL via INTRAVENOUS

## 2013-07-09 MED ORDER — PREDNISONE 20 MG PO TABS: ORAL_TABLET | ORAL | Status: DC

## 2013-07-09 MED ORDER — FENTANYL CITRATE 0.05 MG/ML IJ SOLN
100.0000 ug | Freq: Once | INTRAMUSCULAR | Status: AC
  Administered 2013-07-09: 100 ug via INTRAVENOUS

## 2013-07-09 MED ORDER — FENTANYL CITRATE 0.05 MG/ML IJ SOLN
INTRAMUSCULAR | Status: AC
  Administered 2013-07-09: 100 ug via INTRAVENOUS
  Filled 2013-07-09: qty 2

## 2013-07-09 MED ORDER — PROMETHAZINE HCL 25 MG PO TABS: 25.0000 mg | ORAL_TABLET | Freq: Four times a day (QID) | ORAL | Status: DC | PRN

## 2013-07-09 MED ORDER — SODIUM CHLORIDE 0.9 % IV SOLN
1000.0000 mL | Freq: Once | INTRAVENOUS | Status: AC
  Administered 2013-07-09: 1000 mL via INTRAVENOUS

## 2013-07-09 MED ORDER — SODIUM CHLORIDE 0.9 % IV SOLN
1000.0000 mL | INTRAVENOUS | Status: DC
  Administered 2013-07-09: 1000 mL via INTRAVENOUS

## 2013-07-09 MED ORDER — HYDROCODONE-ACETAMINOPHEN 5-325 MG PO TABS: 2.0000 | ORAL_TABLET | ORAL | Status: DC | PRN

## 2013-07-09 MED ORDER — MORPHINE SULFATE 4 MG/ML IJ SOLN
4.0000 mg | Freq: Once | INTRAMUSCULAR | Status: AC
  Administered 2013-07-09: 4 mg via INTRAVENOUS
  Filled 2013-07-09: qty 1

## 2013-07-09 MED ORDER — PROMETHAZINE HCL 25 MG/ML IJ SOLN
12.5000 mg | Freq: Once | INTRAMUSCULAR | Status: AC
  Administered 2013-07-09: 12.5 mg via INTRAVENOUS
  Filled 2013-07-09: qty 1

## 2013-07-09 MED ORDER — ONDANSETRON HCL 4 MG/2ML IJ SOLN
INTRAMUSCULAR | Status: AC
  Administered 2013-07-09: 4 mg via INTRAVENOUS
  Filled 2013-07-09: qty 2

## 2013-07-09 NOTE — ED Notes (Signed)
Pt has not received any relief from the Zofran.  Still c/o vomiting, abdominal pain.  Patient/family aware that a new attending physician was in the ED and she was the first patient to be seen.

## 2013-07-09 NOTE — ED Notes (Signed)
Pt c/o nausea, vomiting, abdominal pain.  Delano Metz, NP notified.  Order received.

## 2013-07-09 NOTE — ED Notes (Signed)
Dr. Sheral Flow at bedside.

## 2013-07-09 NOTE — ED Notes (Signed)
Plan of care discussed with patient/boyfriend.  She is resting quietly, no more vomiting.

## 2013-07-09 NOTE — ED Notes (Signed)
Pt reports hx of ulcerative colitis.  States rectal bleeding x 4 days.  Bright red, reports abdominal cramping.  Reports that she is changing her pad every 30 minutes.

## 2013-07-09 NOTE — ED Notes (Signed)
Pt resting quietly with eyes closed

## 2013-07-09 NOTE — ED Provider Notes (Signed)
CSN: [id number]     Arrival date & time 07/09/13  1301 History  This chart was scribed for Neta Ehlers, MD by Roxan Diesel, ED scribe.  This patient was seen in room MH05/MH05 and the patient's care was started at 3:23 PM.   Chief Complaint  Patient presents with  . Rectal Bleeding    Patient is a 17 y.o. female presenting with hematochezia. The history is provided by the patient. No language interpreter was used.  Rectal Bleeding Quality:  Bright red (and dark) Amount:  Moderate Duration: 2-3 days. Timing: persistent. Chronicity:  Recurrent Similar prior episodes: yes   Associated symptoms: abdominal pain and vomiting   Associated symptoms: no dizziness and no fever   Risk factors comment:  Ulcerative colitis   HPI Comments: Kimberly Caldwell is a 17 y.o. female with h/o ulcerative colitis who presents to the Emergency Department complaining of 2-3 days of persistent rectal bleeding with associated abdominal pain, nausea, vomiting and diarrhea.  Pt states that she has had at least 10 episodes of "gooey" diarrhea containing bright and dark red blood.  She notices blood every time she moves her bowels.  Abdominal pain is generalized but more severe to the lower abdomen.  It is described as cramping.  It has been constant since onset.  It is not worsened or relived by anything.  Pt also reports that yesterday she began vomiting.  She was given Zofran and has stopped vomiting currently.  She states she has vomited about 8 times in total.  She denies fever, dysuria, frequency.  She is eating and drinking normally.  Pt states her symptoms feel similar to prior ulcerative colitis flare-ups.  She states that 2 years ago she had a severe flare-up and  her Hgb dropped to 3-4.  She has not had this much bleeding since then.  Pt notes that she is currently on her period.  She denies h/o abdominal surgeries.  She states her flare-ups are usually treated successfully with steroids but she has severe  side effects.  GI is Dr. Renee Harder at [facility]   Past Medical History  Diagnosis Date  . Anxiety   . Colitis, ulcerative 2012    ulceration of colon  . Bipolar disorder     Past Surgical History  Procedure Laterality Date  . Wisdom tooth extraction  2007    Family History  Problem Relation Age of Onset  . Alcohol abuse Paternal Grandfather     History  Substance Use Topics  . Smoking status: Former Smoker -- 0.10 packs/day for 2 years    Types: Cigarettes  . Smokeless tobacco: Never Used  . Alcohol Use: No     Comment: Last drink since two years ago    OB History   Grav Para Term Preterm Abortions TAB SAB Ect Mult Living                  Review of Systems  Constitutional: Negative for fever.  HENT: Negative for mouth sores, sore throat and trouble swallowing.   Eyes: Negative for visual disturbance.  Respiratory: Negative for cough, chest tightness, shortness of breath and wheezing.   Cardiovascular: Negative for chest pain.  Gastrointestinal: Positive for nausea, vomiting, abdominal pain, diarrhea, blood in stool and hematochezia. Negative for abdominal distention.  Endocrine: Negative for polydipsia, polyphagia and polyuria.  Genitourinary: Negative for dysuria, frequency and hematuria.  Musculoskeletal: Negative for gait problem.  Skin: Negative for color change, pallor and rash.  Neurological: Negative for  dizziness, syncope and headaches.  Psychiatric/Behavioral: Negative for behavioral problems and confusion.     Allergies  Dilaudid  Home Medications   Current Outpatient Rx  Name  Route  Sig  Dispense  Refill  . azaTHIOprine (IMURAN) 50 MG tablet   Oral   Take 50 mg by mouth at bedtime.          . cephALEXin (KEFLEX) 500 MG capsule   Oral   Take 1 capsule (500 mg total) by mouth 4 (four) times daily.   20 capsule   0   . clonazePAM (KLONOPIN) 0.5 MG tablet   Oral   Take 1 mg by mouth 3 (three) times daily as needed. For anxiety          . divalproex (DEPAKOTE) 500 MG DR tablet   Oral   Take 500 mg by mouth daily.         . fexofenadine (ALLEGRA) 180 MG tablet   Oral   Take 180 mg by mouth daily.         . folic acid (FOLVITE) 1 MG tablet   Oral   Take 1 mg by mouth daily.         Marland Kitchen HYDROcodone-acetaminophen (NORCO) 5-325 MG per tablet   Oral   Take 2 tablets by mouth every 4 (four) hours as needed.   10 tablet   0   . HYDROcodone-acetaminophen (NORCO) 5-325 MG per tablet   Oral   Take 2 tablets by mouth every 4 (four) hours as needed.   20 tablet   0   . HYDROcodone-acetaminophen (NORCO/VICODIN) 5-325 MG per tablet   Oral   Take 2 tablets by mouth every 4 (four) hours as needed for pain.   15 tablet   0   . hyoscyamine (LEVSIN, ANASPAZ) 0.125 MG tablet   Oral   Take 0.125 mg by mouth every 4 (four) hours as needed.         . lamoTRIgine (LAMICTAL) 200 MG tablet   Oral   Take 200 mg by mouth daily.         Marland Kitchen levonorgestrel-ethinyl estradiol (ENPRESSE,TRIVORA) tablet   Oral   Take 1 tablet by mouth daily.         Marland Kitchen levonorgestrel-ethinyl estradiol (NORDETTE) 0.15-30 MG-MCG tablet   Oral   Take 1 tablet by mouth daily.         Marland Kitchen lurasidone (LATUDA) 80 MG TABS   Oral   Take by mouth daily with breakfast.         . nitrofurantoin, macrocrystal-monohydrate, (MACROBID) 100 MG capsule   Oral   Take 100 mg by mouth 2 (two) times daily. Last dose tuesday         . ondansetron (ZOFRAN ODT) 8 MG disintegrating tablet      42m ODT q4 hours prn nausea   10 tablet   0   . predniSONE (DELTASONE) 20 MG tablet      3 tabs po day one, then 2 po daily x 4 days   11 tablet   0   . promethazine (PHENERGAN) 25 MG suppository   Rectal   Place 1 suppository (25 mg total) rectally every 6 (six) hours as needed for nausea.   12 each   0   . promethazine (PHENERGAN) 25 MG tablet   Oral   Take 1 tablet (25 mg total) by mouth every 6 (six) hours as needed for nausea.   15 tablet    0   .  promethazine (PHENERGAN) 25 MG tablet   Oral   Take 1 tablet (25 mg total) by mouth every 6 (six) hours as needed for nausea or vomiting.   20 tablet   0   . QUEtiapine (SEROQUEL) 400 MG tablet   Oral   Take 400 mg by mouth at bedtime.         . sulfaSALAzine (AZULFIDINE) 500 MG tablet   Oral   Take 500 mg by mouth 4 (four) times daily.          BP 121/78  Pulse 95  Temp(Src) 97.7 F (36.5 C) (Oral)  Resp 16  Ht 5' 5"  (1.651 m)  Wt 135 lb (61.236 kg)  BMI 22.47 kg/m2  SpO2 98%  Physical Exam  Nursing note and vitals reviewed. Constitutional: She is oriented to person, place, and time. She appears well-developed and well-nourished. No distress.  HENT:  Head: Normocephalic.  Mouth/Throat: Oropharynx is clear and moist.  Eyes: Pupils are equal, round, and reactive to light.  Neck: Neck supple.  Cardiovascular: Normal rate, regular rhythm and normal heart sounds.   Pulmonary/Chest: Effort normal and breath sounds normal. No respiratory distress. She has no wheezes.  Abdominal: Soft. Bowel sounds are normal. She exhibits no distension. There is tenderness (generalized). There is no rebound and no guarding.  Musculoskeletal: She exhibits no edema and no tenderness.  Neurological: She is alert and oriented to person, place, and time.  Skin: Skin is warm and dry.  Psychiatric: She has a normal mood and affect.    ED Course  Procedures (including critical care time)  DIAGNOSTIC STUDIES: Oxygen Saturation is 98% on room air, normal by my interpretation.    COORDINATION OF CARE: 3:30 PM-Discussed treatment plan which includes labs and IV fluids with pt at bedside and pt agreed to plan.    Labs Review Labs Reviewed  URINALYSIS, ROUTINE W REFLEX MICROSCOPIC - Abnormal; Notable for the following:    Hgb urine dipstick MODERATE (*)    All other components within normal limits  URINE MICROSCOPIC-ADD ON - Abnormal; Notable for the following:    Bacteria, UA FEW  (*)    All other components within normal limits  CBC WITH DIFFERENTIAL  PREGNANCY, URINE  COMPREHENSIVE METABOLIC PANEL   Imaging Review No results found.  EKG Interpretation   None       MDM   1. Exacerbation of ulcerative colitis    Pt is a 17 y.o. female with Pmhx as above who presents with symptoms c/w prior UC flares w/ ab pain, nv/, rectal bleeding.  VSS, but uncomfortable, but in NAD on PE.  Abdomen w/ generalized ttp w/o rebound or guarding, is soft.  Pt treated w/ several doses IV morphine, phenergan, and IVF with significant improvement of symptoms. Pt then able to tolerate PO.  Pt safe for d.c w/ close outpt f/u with her GI.  Return precautions given for new or worsening symptoms including worsening pain, fever, lightheadedness/syncope.     I personally performed the services described in this documentation, which was scribed in my presence. The recorded information has been reviewed and is accurate.     Neta Ehlers, MD 07/10/13 1121

## 2013-07-09 NOTE — ED Notes (Addendum)
Pt/family somewhat agitated that patient has not been evaluated yet and patient was actively vomiting/severe abdominal pain.  ED abdominal pain order set utilized for pain control/IV fluid administration.

## 2013-12-11 ENCOUNTER — Encounter (HOSPITAL_BASED_OUTPATIENT_CLINIC_OR_DEPARTMENT_OTHER): Payer: Self-pay | Admitting: Emergency Medicine

## 2013-12-11 ENCOUNTER — Emergency Department (HOSPITAL_BASED_OUTPATIENT_CLINIC_OR_DEPARTMENT_OTHER)
Admission: EM | Admit: 2013-12-11 | Discharge: 2013-12-11 | Disposition: A | Discharge status: Home / Self Care | Payer: BC Managed Care – PPO | Attending: Emergency Medicine | Admitting: Emergency Medicine

## 2013-12-11 DIAGNOSIS — K5190 Ulcerative colitis, unspecified, without complications: Secondary | ICD-10-CM | POA: Insufficient documentation

## 2013-12-11 DIAGNOSIS — IMO0002 Reserved for concepts with insufficient information to code with codable children: Secondary | ICD-10-CM | POA: Insufficient documentation

## 2013-12-11 DIAGNOSIS — F319 Bipolar disorder, unspecified: Secondary | ICD-10-CM | POA: Insufficient documentation

## 2013-12-11 DIAGNOSIS — G43909 Migraine, unspecified, not intractable, without status migrainosus: Secondary | ICD-10-CM | POA: Insufficient documentation

## 2013-12-11 DIAGNOSIS — Z87891 Personal history of nicotine dependence: Secondary | ICD-10-CM | POA: Insufficient documentation

## 2013-12-11 DIAGNOSIS — F411 Generalized anxiety disorder: Secondary | ICD-10-CM | POA: Insufficient documentation

## 2013-12-11 DIAGNOSIS — Z79899 Other long term (current) drug therapy: Secondary | ICD-10-CM | POA: Insufficient documentation

## 2013-12-11 DIAGNOSIS — Z792 Long term (current) use of antibiotics: Secondary | ICD-10-CM | POA: Insufficient documentation

## 2013-12-11 HISTORY — DX: Migraine, unspecified, not intractable, without status migrainosus: G43.909

## 2013-12-11 MED ORDER — FENTANYL CITRATE 0.05 MG/ML IJ SOLN
100.0000 ug | Freq: Once | INTRAMUSCULAR | Status: AC
  Administered 2013-12-11: 100 ug via INTRAVENOUS
  Filled 2013-12-11: qty 2

## 2013-12-11 MED ORDER — FENTANYL CITRATE 0.05 MG/ML IJ SOLN
50.0000 ug | Freq: Once | INTRAMUSCULAR | Status: AC
  Administered 2013-12-11: 50 ug via INTRAVENOUS

## 2013-12-11 MED ORDER — KETOROLAC TROMETHAMINE 15 MG/ML IJ SOLN
15.0000 mg | Freq: Once | INTRAMUSCULAR | Status: AC
  Administered 2013-12-11: 15 mg via INTRAVENOUS
  Filled 2013-12-11: qty 1

## 2013-12-11 MED ORDER — SODIUM CHLORIDE 0.9 % IV BOLUS (SEPSIS)
1000.0000 mL | Freq: Once | INTRAVENOUS | Status: AC
  Administered 2013-12-11: 1000 mL via INTRAVENOUS

## 2013-12-11 MED ORDER — FENTANYL CITRATE 0.05 MG/ML IJ SOLN
INTRAMUSCULAR | Status: AC
  Administered 2013-12-11: 50 ug via INTRAVENOUS
  Filled 2013-12-11: qty 2

## 2013-12-11 MED ORDER — PROMETHAZINE HCL 25 MG/ML IJ SOLN
25.0000 mg | Freq: Once | INTRAMUSCULAR | Status: AC
  Administered 2013-12-11: 25 mg via INTRAVENOUS
  Filled 2013-12-11: qty 1

## 2013-12-11 NOTE — Discharge Instructions (Signed)
Migraine Headache A migraine headache is an intense, throbbing pain on one or both sides of your head. A migraine can last for 30 minutes to several hours. CAUSES  The exact cause of a migraine headache is not always known. However, a migraine may be caused when nerves in the brain become irritated and release chemicals that cause inflammation. This causes pain. Certain things may also trigger migraines, such as:  Alcohol.  Smoking.  Stress.  Menstruation.  Aged cheeses.  Foods or drinks that contain nitrates, glutamate, aspartame, or tyramine.  Lack of sleep.  Chocolate.  Caffeine.  Hunger.  Physical exertion.  Fatigue.  Medicines used to treat chest pain (nitroglycerine), birth control pills, estrogen, and some blood pressure medicines. SIGNS AND SYMPTOMS  Pain on one or both sides of your head.  Pulsating or throbbing pain.  Severe pain that prevents daily activities.  Pain that is aggravated by any physical activity.  Nausea, vomiting, or both.  Dizziness.  Pain with exposure to bright lights, loud noises, or activity.  General sensitivity to bright lights, loud noises, or smells. Before you get a migraine, you may get warning signs that a migraine is coming (aura). An aura may include:  Seeing flashing lights.  Seeing bright spots, halos, or zig-zag lines.  Having tunnel vision or blurred vision.  Having feelings of numbness or tingling.  Having trouble talking.  Having muscle weakness. DIAGNOSIS  A migraine headache is often diagnosed based on:  Symptoms.  Physical exam.  A CT scan or MRI of your head. These imaging tests cannot diagnose migraines, but they can help rule out other causes of headaches. TREATMENT Medicines may be given for pain and nausea. Medicines can also be given to help prevent recurrent migraines.  HOME CARE INSTRUCTIONS  Only take over-the-counter or prescription medicines for pain or discomfort as directed by your  health care provider. The use of long-term narcotics is not recommended.  Lie down in a dark, quiet room when you have a migraine.  Keep a journal to find out what may trigger your migraine headaches. For example, write down:  What you eat and drink.  How much sleep you get.  Any change to your diet or medicines.  Limit alcohol consumption.  Quit smoking if you smoke.  Get 7 9 hours of sleep, or as recommended by your health care provider.  Limit stress.  Keep lights dim if bright lights bother you and make your migraines worse. SEEK IMMEDIATE MEDICAL CARE IF:   Your migraine becomes severe.  You have a fever.  You have a stiff neck.  You have vision loss.  You have muscular weakness or loss of muscle control.  You start losing your balance or have trouble walking.  You feel faint or pass out.  You have severe symptoms that are different from your first symptoms. MAKE SURE YOU:   Understand these instructions.  Will watch your condition.  Will get help right away if you are not doing well or get worse. Document Released: 06/29/2005 Document Revised: 04/19/2013 Document Reviewed: 03/06/2013 [employer] Patient Information 2014 [employer].  

## 2013-12-11 NOTE — ED Notes (Signed)
Patient states she has migraine headache, but feels her ulcerative collitus is main complaint causing nausea.

## 2013-12-11 NOTE — ED Provider Notes (Signed)
CSN: [id number]     Arrival date & time 12/11/13  0123 History   First MD Initiated Contact with Patient 12/11/13 0139     Chief Complaint  Patient presents with  . Headache     (Consider location/radiation/quality/duration/timing/severity/associated sxs/prior Treatment) HPI This is a 18 year old female with a history of ulcerative colitis and migraine headaches. She was recently re-started on Depakote for her headaches. She is here with a headache that began yesterday evening about 10 PM. It has been associated with nausea and vomiting. As result of vomiting she is now having diffuse abdominal pain. This abdominal pain is not consistent with previous ulcerative colitis and she is not having diarrhea. Her headache is located diffusely in the head it is throbbing. She describes symptoms of severe. The headache is exacerbated by light. She has taken Zofran for nausea and vomiting without relief.  Past Medical History  Diagnosis Date  . Anxiety   . Colitis, ulcerative 2012    ulceration of colon  . Bipolar disorder    Past Surgical History  Procedure Laterality Date  . Wisdom tooth extraction  2007   Family History  Problem Relation Age of Onset  . Alcohol abuse Paternal Grandfather    History  Substance Use Topics  . Smoking status: Former Smoker -- 0.10 packs/day for 2 years    Types: Cigarettes  . Smokeless tobacco: Never Used  . Alcohol Use: No     Comment: Last drink since two years ago   OB History   Grav Para Term Preterm Abortions TAB SAB Ect Mult Living                 Review of Systems  All other systems reviewed and are negative.  Allergies  Dilaudid  Home Medications   Prior to Admission medications   Medication Sig Start Date End Date Taking? Authorizing Provider  azaTHIOprine (IMURAN) 50 MG tablet Take 50 mg by mouth at bedtime.     Historical Provider, MD  cephALEXin (KEFLEX) 500 MG capsule Take 1 capsule (500 mg total) by mouth 4 (four) times daily.  02/27/13   Hoy Morn, MD  clonazePAM (KLONOPIN) 0.5 MG tablet Take 1 mg by mouth 3 (three) times daily as needed. For anxiety    Historical Provider, MD  divalproex (DEPAKOTE) 500 MG DR tablet Take 500 mg by mouth daily.    Historical Provider, MD  fexofenadine (ALLEGRA) 180 MG tablet Take 180 mg by mouth daily.    Historical Provider, MD  folic acid (FOLVITE) 1 MG tablet Take 1 mg by mouth daily.    Historical Provider, MD  HYDROcodone-acetaminophen (NORCO) 5-325 MG per tablet Take 2 tablets by mouth every 4 (four) hours as needed. 06/05/13   Neta Ehlers, MD  HYDROcodone-acetaminophen (NORCO) 5-325 MG per tablet Take 2 tablets by mouth every 4 (four) hours as needed. 07/09/13   Neta Ehlers, MD  HYDROcodone-acetaminophen (NORCO/VICODIN) 5-325 MG per tablet Take 2 tablets by mouth every 4 (four) hours as needed for pain. 02/25/13   Malvin Johns, MD  hyoscyamine (LEVSIN, ANASPAZ) 0.125 MG tablet Take 0.125 mg by mouth every 4 (four) hours as needed.    Historical Provider, MD  lamoTRIgine (LAMICTAL) 200 MG tablet Take 200 mg by mouth daily.    Historical Provider, MD  levonorgestrel-ethinyl estradiol (ENPRESSE,TRIVORA) tablet Take 1 tablet by mouth daily.    Historical Provider, MD  levonorgestrel-ethinyl estradiol (NORDETTE) 0.15-30 MG-MCG tablet Take 1 tablet by mouth daily.  Historical Provider, MD  lurasidone (LATUDA) 80 MG TABS Take by mouth daily with breakfast.    Historical Provider, MD  nitrofurantoin, macrocrystal-monohydrate, (MACROBID) 100 MG capsule Take 100 mg by mouth 2 (two) times daily. Last dose tuesday    Historical Provider, MD  ondansetron (ZOFRAN ODT) 8 MG disintegrating tablet 30m ODT q4 hours prn nausea 02/25/13   MMalvin Johns MD  predniSONE (DELTASONE) 20 MG tablet 3 tabs po day one, then 2 po daily x 4 days 07/09/13   MNeta Ehlers MD  promethazine (PHENERGAN) 25 MG suppository Place 1 suppository (25 mg total) rectally every 6 (six) hours as needed for  nausea. 02/27/13   KHoy Morn MD  promethazine (PHENERGAN) 25 MG tablet Take 1 tablet (25 mg total) by mouth every 6 (six) hours as needed for nausea. 02/27/13   KHoy Morn MD  promethazine (PHENERGAN) 25 MG tablet Take 1 tablet (25 mg total) by mouth every 6 (six) hours as needed for nausea or vomiting. 07/09/13   MNeta Ehlers MD  QUEtiapine (SEROQUEL) 400 MG tablet Take 400 mg by mouth at bedtime.    Historical Provider, MD  sulfaSALAzine (AZULFIDINE) 500 MG tablet Take 500 mg by mouth 4 (four) times daily.    Historical Provider, MD   BP 113/71  Pulse 101  Temp(Src) 97.8 F (36.6 C) (Oral)  Resp 20  Ht 5' 5"  (1.651 m)  Wt 130 lb (58.968 kg)  BMI 21.63 kg/m2  SpO2 100%  LMP 11/10/2013  Physical Exam General: Well-developed, thin female in no acute distress; appearance consistent with age of record HENT: normocephalic; atraumatic Eyes: pupils equal, round and reactive to light; extraocular muscles intact; photophobia Neck: supple Heart: regular rate and rhythm Lungs: clear to auscultation bilaterally Abdomen: soft; nondistended; diffusely tender; bowel sounds present Extremities: No deformity; full range of motion; pulses normal Neurologic: Awake, alert and oriented; motor function intact in all extremities and symmetric; no facial droop Skin: Warm and dry Psychiatric: Anxious; flat affect    ED Course  Procedures (including critical care time)   MDM  3:40 AM Patient feeling much better after IV fluids and IV medications. She states she is ready to go home this time.   JWynetta Fines MD 12/11/13 [phone]

## 2013-12-11 NOTE — ED Notes (Signed)
C/o general h/a that started around 2200 Sunday night. C/o light sensitivity. C/o n/v has taken zofran at home but states not helping. Hx of h/a as well. C/o general abd pain sharp with vomiting. Denies any fevers. Denies any recent illness.

## 2014-03-05 ENCOUNTER — Emergency Department (HOSPITAL_BASED_OUTPATIENT_CLINIC_OR_DEPARTMENT_OTHER): Payer: BC Managed Care – PPO

## 2014-03-05 ENCOUNTER — Encounter (HOSPITAL_BASED_OUTPATIENT_CLINIC_OR_DEPARTMENT_OTHER): Payer: Self-pay | Admitting: Emergency Medicine

## 2014-03-05 ENCOUNTER — Emergency Department (HOSPITAL_BASED_OUTPATIENT_CLINIC_OR_DEPARTMENT_OTHER)
Admission: EM | Admit: 2014-03-05 | Discharge: 2014-03-06 | Disposition: A | Discharge status: Home / Self Care | Payer: BC Managed Care – PPO | Attending: Emergency Medicine | Admitting: Emergency Medicine

## 2014-03-05 DIAGNOSIS — K625 Hemorrhage of anus and rectum: Secondary | ICD-10-CM | POA: Insufficient documentation

## 2014-03-05 DIAGNOSIS — K5190 Ulcerative colitis, unspecified, without complications: Secondary | ICD-10-CM | POA: Insufficient documentation

## 2014-03-05 DIAGNOSIS — G43909 Migraine, unspecified, not intractable, without status migrainosus: Secondary | ICD-10-CM | POA: Insufficient documentation

## 2014-03-05 DIAGNOSIS — F319 Bipolar disorder, unspecified: Secondary | ICD-10-CM | POA: Diagnosis not present

## 2014-03-05 DIAGNOSIS — Z87891 Personal history of nicotine dependence: Secondary | ICD-10-CM | POA: Diagnosis not present

## 2014-03-05 DIAGNOSIS — Z792 Long term (current) use of antibiotics: Secondary | ICD-10-CM | POA: Diagnosis not present

## 2014-03-05 DIAGNOSIS — Z79899 Other long term (current) drug therapy: Secondary | ICD-10-CM | POA: Diagnosis not present

## 2014-03-05 DIAGNOSIS — Z3202 Encounter for pregnancy test, result negative: Secondary | ICD-10-CM | POA: Insufficient documentation

## 2014-03-05 DIAGNOSIS — IMO0002 Reserved for concepts with insufficient information to code with codable children: Secondary | ICD-10-CM | POA: Insufficient documentation

## 2014-03-05 DIAGNOSIS — F411 Generalized anxiety disorder: Secondary | ICD-10-CM | POA: Diagnosis not present

## 2014-03-05 DIAGNOSIS — K51911 Ulcerative colitis, unspecified with rectal bleeding: Secondary | ICD-10-CM

## 2014-03-05 LAB — COMPREHENSIVE METABOLIC PANEL
ALT: 8 U/L (ref 0–35)
AST: 17 U/L (ref 0–37)
Albumin: 3.5 g/dL (ref 3.5–5.2)
Alkaline Phosphatase: 31 U/L — ABNORMAL LOW (ref 39–117)
Anion gap: 12 (ref 5–15)
BUN: 11 mg/dL (ref 6–23)
CO2: 26 meq/L (ref 19–32)
Calcium: 9.3 mg/dL (ref 8.4–10.5)
Chloride: 102 mEq/L (ref 96–112)
Creatinine, Ser: 0.7 mg/dL (ref 0.50–1.10)
GFR calc Af Amer: >90 mL/min (ref >90)
GFR calc non Af Amer: >90 mL/min (ref >90)
Glucose, Bld: 106 mg/dL — ABNORMAL HIGH (ref 70–99)
Potassium: 3.2 mEq/L — ABNORMAL LOW (ref 3.7–5.3)
SODIUM: 140 meq/L (ref 137–147)
TOTAL PROTEIN: 6.6 g/dL (ref 6.0–8.3)
Total Bilirubin: 0.3 mg/dL (ref 0.3–1.2)

## 2014-03-05 LAB — PREGNANCY, URINE: PREG TEST UR: NEGATIVE

## 2014-03-05 LAB — CBC WITH DIFFERENTIAL/PLATELET
BASOS ABS: 0 10*3/uL (ref 0.0–0.1)
Basophils Relative: 0 % (ref 0–1)
EOS ABS: 0.1 10*3/uL (ref 0.0–0.7)
EOS PCT: 1 % (ref 0–5)
HCT: 39.1 % (ref 36.0–46.0)
Hemoglobin: 13.5 g/dL (ref 12.0–15.0)
LYMPHS PCT: 35 % (ref 12–46)
Lymphs Abs: 3.1 10*3/uL (ref 0.7–4.0)
MCH: 32.8 pg (ref 26.0–34.0)
MCHC: 34.5 g/dL (ref 30.0–36.0)
MCV: 94.9 fL (ref 78.0–100.0)
Monocytes Absolute: 0.4 10*3/uL (ref 0.1–1.0)
Monocytes Relative: 4 % (ref 3–12)
NEUTROS PCT: 60 % (ref 43–77)
Neutro Abs: 5.2 10*3/uL (ref 1.7–7.7)
Platelets: 197 10*3/uL (ref 150–400)
RBC: 4.12 MIL/uL (ref 3.87–5.11)
RDW: 12.6 % (ref 11.5–15.5)
WBC: 8.8 10*3/uL (ref 4.0–10.5)

## 2014-03-05 LAB — URINALYSIS, ROUTINE W REFLEX MICROSCOPIC
Bilirubin Urine: NEGATIVE
GLUCOSE, UA: NEGATIVE mg/dL
HGB URINE DIPSTICK: NEGATIVE
Ketones, ur: NEGATIVE mg/dL
Nitrite: NEGATIVE
PH: 7.5 (ref 5.0–8.0)
Protein, ur: NEGATIVE mg/dL
SPECIFIC GRAVITY, URINE: 1.017 (ref 1.005–1.030)
Urobilinogen, UA: 0.2 mg/dL (ref 0.0–1.0)

## 2014-03-05 LAB — URINE MICROSCOPIC-ADD ON

## 2014-03-05 MED ORDER — IOHEXOL 300 MG/ML  SOLN
100.0000 mL | Freq: Once | INTRAMUSCULAR | Status: AC | PRN
  Administered 2014-03-05: 100 mL via INTRAVENOUS

## 2014-03-05 MED ORDER — MORPHINE SULFATE 4 MG/ML IJ SOLN
4.0000 mg | Freq: Once | INTRAMUSCULAR | Status: AC
  Administered 2014-03-05: 4 mg via INTRAVENOUS
  Filled 2014-03-05: qty 1

## 2014-03-05 MED ORDER — PREDNISONE 50 MG PO TABS
ORAL_TABLET | ORAL | Status: AC
  Filled 2014-03-05: qty 1

## 2014-03-05 MED ORDER — OXYCODONE-ACETAMINOPHEN 5-325 MG PO TABS: 2.0000 | ORAL_TABLET | ORAL | Status: DC | PRN

## 2014-03-05 MED ORDER — IOHEXOL 300 MG/ML  SOLN
50.0000 mL | Freq: Once | INTRAMUSCULAR | Status: AC | PRN
  Administered 2014-03-05: 50 mL via ORAL

## 2014-03-05 MED ORDER — PREDNISONE 50 MG PO TABS
60.0000 mg | ORAL_TABLET | Freq: Once | ORAL | Status: AC
  Administered 2014-03-05: 60 mg via ORAL

## 2014-03-05 MED ORDER — SODIUM CHLORIDE 0.9 % IV SOLN
Freq: Once | INTRAVENOUS | Status: AC
  Administered 2014-03-05: 21:00:00 via INTRAVENOUS

## 2014-03-05 MED ORDER — PREDNISONE 10 MG PO TABS
ORAL_TABLET | ORAL | Status: AC
  Filled 2014-03-05: qty 1

## 2014-03-05 MED ORDER — ONDANSETRON HCL 4 MG/2ML IJ SOLN
4.0000 mg | Freq: Once | INTRAMUSCULAR | Status: AC
  Administered 2014-03-05: 4 mg via INTRAVENOUS
  Filled 2014-03-05: qty 2

## 2014-03-05 MED ORDER — PREDNISONE 10 MG PO TABS: ORAL_TABLET | ORAL | Status: DC

## 2014-03-05 MED ORDER — PROMETHAZINE HCL 25 MG PO TABS: 25.0000 mg | ORAL_TABLET | Freq: Four times a day (QID) | ORAL | Status: DC | PRN

## 2014-03-05 NOTE — Discharge Instructions (Signed)
Ulcerative Colitis Ulcerative colitis is a long lasting swelling and soreness (inflammation) of the colon (large intestine). In patients with ulcerative colitis, sores (ulcers) and inflammation of the inner lining of the colon lead to illness. Ulcerative colitis can also cause problems outside the digestive tract.  Ulcerative colitis is closely related to another condition of inflammation of the intestines called Crohn's disease. Together, they are frequently referred to as inflammatory bowel disease (IBD). Ulcerative colitis and Crohn's diseases are conditions that can last years to decades. Men and women are affected equally. They most commonly begin during adolescence and early adulthood. SYMPTOMS  Common symptoms of ulcerative colitis include rectal bleeding and diarrhea. There is a wide range of symptoms among patients with this disease depending on how severe the disease is. Some of these symptoms are:  Abdominal pain or cramping.  Diarrhea.  Fever.  Tiredness (fatigue).  Weight loss.  Night sweats.  Rectal pain.  Feeling the immediate need to have a bowel movement (rectal urgency). CAUSES  Ulcerative colitis is caused by increased activity of the immune system in the intestines. The immune system is the system that protects the body against disease such as harmful bacteria, viruses, fungi, and other foreign invaders. When the immune system overacts, it causes inflammation. The cause of the increased immune system activity is not known. This over activity causes long-lasting inflammation and ulceration. This condition may be passed down from your parents (inherited). Brothers, sisters, children, and parents of patients with IBD are more likely to develop these diseases. It is not contagious. This means you cannot catch it from someone else. DIAGNOSIS  Your caregiver may suspect ulcerative colitis based on your symptoms and exam. Blood tests may confirm that there is a problem. You may  be asked to submit a stool specimen for examination. X-rays and CT scans may be necessary. Ultimately, the diagnosis is usually made after a flexible tube is inserted via your anus and your colon is examined under sedation (colonoscopy). With this test, the specialist can take a tiny tissue sample from inside the bowel (biopsy). Examination of this biopsy tissue under a microscopy can reveal ulcerative colitis as the cause of your symptoms. TREATMENT   There is no cure for ulcerative colitis.  Complications such as massive bleeding from the colon (hemorrhage), development of a hole in the colon (perforation), or the development of precancerous or cancerous changes of the colon may require surgery.  Medications are often used to decrease inflammation and control the immune system. These include medicines related to aspirin, steroid medications, and newer and stronger medications to slow down the immune system. Some medications may be used as suppositories or enemas. A number of other medications are used or have been studied. Your caregiver will make specific recommendations. HOME CARE INSTRUCTIONS   There is no cure for ulcerative colitis disease. The best treatment is frequent checkups with your caregiver. Periodic reevaluation is important.  Symptoms such as diarrhea can be controlled with medications. Avoid foods that have a laxative effect such fresh fruit and vegetables and dairy products. During flare ups, you can rest your bowel by staying away from solid foods. Drink clear liquids frequently during the day. Electrolyte or rehydrating fluids are best. Your caregiver can help you with suggestions. Drink often to prevent dehydration. When diarrhea has cleared, eat smaller meals and more often. Avoid food additives and stimulants such as caffeine (coffee, tea, many sodas, or chocolate). Avoid dairy products. Enzyme supplements may help if you develop intolerance to  a sugar in dairy products  (lactose). Ask your caregiver or dietitian about specific dietary instructions.  If you had surgery, be sure you understand your care instructions thoroughly, including proper care of any surgical wounds.  Take any medications exactly as prescribed.  Try to maintain a positive attitude. Learn relaxation techniques such as self hypnosis, mental imaging, and muscle relaxation. If possible, avoid stresses that aggravate your condition. Exercise regularly. Follow your diet. Always get plenty of rest. SEEK MEDICAL CARE IF:   Your symptoms fail to improve after a week or two of new treatment.  You experience continued weight loss.  You have ongoing crampy digestion or loose bowels.  You develop a new skin rash, skin sores, or eye problems. SEEK IMMEDIATE MEDICAL CARE IF:   You have worsening of your symptoms or develop new symptoms.  You have an oral temperature above 102 F (38.9 C), not controlled by medicine.  You develop bloody diarrhea.  You have severe abdominal pain. Document Released: 04/08/2005 Document Revised: 09/21/2011 Document Reviewed: 03/08/2007 [facility] Patient Information 2015 [place], [place]. This information is not intended to replace advice given to you by your health care provider. Make sure you discuss any questions you have with your health care provider. Low-Fiber Diet Fiber is found in fruits, vegetables, and whole grains. A low-fiber diet restricts fibrous foods that are not digested in the small intestine. A diet containing about 10-15 grams of fiber per day is considered low fiber. Low-fiber diets may be used to:  Promote healing and rest the bowel during intestinal flare-ups.  Prevent blockage of a partially obstructed or narrowed gastrointestinal tract.  Reduce fecal weight and volume.  Slow the movement of feces. You may be on a low-fiber diet as a transitional diet following surgery, after an injury (trauma), or because of a short (acute) or lifelong  (chronic) illness. Your health care provider will determine the length of time you need to stay on this diet.  WHAT DO I NEED TO KNOW ABOUT A LOW-FIBER DIET? Always check the fiber content on the packaging's Nutrition Facts label, especially on foods from the grains list. Ask your dietitian if you have questions about specific foods that are related to your condition, especially if the food is not listed below. In general, a low-fiber food will have less than 2 g of fiber. WHAT FOODS CAN I EAT? Grains All breads and crackers made with white flour. Sweet rolls, doughnuts, waffles, pancakes, [place] toast, bagels. Pretzels, Melba toast, zwieback. Well-cooked cereals, such as cornmeal, farina, or cream cereals. Dry cereals that do not contain whole grains, fruit, or nuts, such as refined corn, wheat, rice, and oat cereals. Potatoes prepared any way without skins, plain pastas and noodles, refined white rice. Use white flour for baking and making sauces. Use allowed list of grains for casseroles, dumplings, and puddings.  Vegetables Strained tomato and vegetable juices. Fresh lettuce, cucumber, spinach. Well-cooked (no skin or pulp) or canned vegetables, such as asparagus, bean sprouts, beets, carrots, green beans, mushrooms, potatoes, pumpkin, spinach, yellow squash, tomato sauce/puree, turnips, yams, and zucchini. Keep servings limited to  cup.  Fruits All fruit juices except prune juice. Cooked or canned fruits without skin and seeds, such as applesauce, apricots, cherries, fruit cocktail, grapefruit, grapes, mandarin oranges, melons, peaches, pears, pineapple, and plums. Fresh fruits without skin, such as apricots, avocados, bananas, melons, pineapple, nectarines, and peaches. Keep servings limited to  cup or 1 piece.  Meat and Other Protein Sources Ground or well-cooked tender  beef, ham, veal, lamb, pork, or poultry. Eggs, plain cheese. Fish, oysters, shrimp, lobster, and other seafood. Liver, organ  meats. Smooth nut butters. Dairy All milk products and alternative dairy substitutes, such as soy, rice, almond, and coconut, not containing added whole nuts, seeds, or added fruit. Beverages Decaf coffee, fruit, and vegetable juices or smoothies (small amounts, with no pulp or skins, and with fruits from allowed list), sports drinks, herbal tea. Condiments Ketchup, mustard, vinegar, cream sauce, cheese sauce, cocoa powder. Spices in moderation, such as allspice, basil, bay leaves, celery powder or leaves, cinnamon, cumin powder, curry powder, ginger, mace, marjoram, onion or garlic powder, oregano, paprika, parsley flakes, ground pepper, rosemary, sage, savory, tarragon, thyme, and turmeric. Sweets and Desserts Plain cakes and cookies, pie made with allowed fruit, pudding, custard, cream pie. Gelatin, fruit, ice, sherbet, frozen ice pops. Ice cream, ice milk without nuts. Plain hard candy, honey, jelly, molasses, syrup, sugar, chocolate syrup, gumdrops, marshmallows. Limit overall sugar intake.  Fats and Oil Margarine, butter, cream, mayonnaise, salad oils, plain salad dressings made from allowed foods. Choose healthy fats such as olive oil, canola oil, and omega-3 fatty acids (such as found in salmon or tuna) when possible.  Other Bouillon, broth, or cream soups made from allowed foods. Any strained soup. Casseroles or mixed dishes made with allowed foods. The items listed above may not be a complete list of recommended foods or beverages. Contact your dietitian for more options.  WHAT FOODS ARE NOT RECOMMENDED? Grains All whole wheat and whole grain breads and crackers. Multigrains, rye, bran seeds, nuts, or coconut. Cereals containing whole grains, multigrains, bran, coconut, nuts, raisins. Cooked or dry oatmeal, steel-cut oats. Coarse wheat cereals, granola. Cereals advertised as high fiber. Potato skins. Whole grain pasta, wild or brown rice. Popcorn. Coconut flour. Bran, buckwheat, corn bread,  multigrains, rye, wheat germ.  Vegetables Fresh, cooked or canned vegetables, such as artichokes, asparagus, beet greens, broccoli, Brussels sprouts, cabbage, celery, cauliflower, corn, eggplant, kale, legumes or beans, okra, peas, and tomatoes. Avoid large servings of any vegetables, especially raw vegetables.  Fruits Fresh fruits, such as apples with or without skin, berries, cherries, figs, grapes, grapefruit, guavas, kiwis, mangoes, oranges, papayas, pears, persimmons, pineapple, and pomegranate. Prune juice and juices with pulp, stewed or dried prunes. Dried fruits, dates, raisins. Fruit seeds or skins. Avoid large servings of all fresh fruits. Meats and Other Protein Sources Tough, fibrous meats with gristle. Chunky nut butter. Cheese made with seeds, nuts, or other foods not recommended. Nuts, seeds, legumes (beans, including baked beans), dried peas, beans, lentils.  Dairy Yogurt or cheese that contains nuts, seeds, or added fruit.  Beverages Fruit juices with high pulp, prune juice. Caffeinated coffee and teas.  Condiments Coconut, maple syrup, pickles, olives. Sweets and Desserts Desserts, cookies, or candies that contain nuts or coconut, chunky peanut butter, dried fruits. Jams, preserves with seeds, marmalade. Large amounts of sugar and sweets. Any other dessert made with fruits from the not recommended list.  Other Soups made from vegetables that are not recommended or that contain other foods not recommended.  The items listed above may not be a complete list of foods and beverages to avoid. Contact your dietitian for more information. Document Released: 12/19/2001 Document Revised: 07/04/2013 Document Reviewed: 05/22/2013 [facility] Patient Information 2015 [place], [place]. This information is not intended to replace advice given to you by your health care provider. Make sure you discuss any questions you have with your health care provider.

## 2014-03-05 NOTE — ED Notes (Signed)
Patient states that she had IBS and ulcerative colitis, but for the last 24 hours she has had an increase in her rectal bleeding. The patient also reports pain in her abdominal area.

## 2014-03-05 NOTE — ED Provider Notes (Signed)
CSN: [id number]     Arrival date & time 03/05/14  1850 History   First MD Initiated Contact with Patient 03/05/14 2004     Chief Complaint  Patient presents with  . Rectal Bleeding     (Consider location/radiation/quality/duration/timing/severity/associated sxs/prior Treatment) Patient is a 18 y.o. female presenting with hematochezia. The history is provided by the patient. No language interpreter was used.  Rectal Bleeding Quality:  Bright red Amount:  Moderate Duration:  2 days Timing:  Constant Progression:  Worsening Chronicity:  New Context: diarrhea   Similar prior episodes: yes   Relieved by:  Nothing Worsened by:  Nothing tried Ineffective treatments:  None tried Associated symptoms: no abdominal pain   Risk factors: no liver disease     Past Medical History  Diagnosis Date  . Anxiety   . Colitis, ulcerative 2012    ulceration of colon  . Bipolar disorder   . Migraine    Past Surgical History  Procedure Laterality Date  . Wisdom tooth extraction  2007   Family History  Problem Relation Age of Onset  . Alcohol abuse Paternal Grandfather    History  Substance Use Topics  . Smoking status: Former Smoker -- 0.10 packs/day for 2 years    Types: Cigarettes  . Smokeless tobacco: Never Used  . Alcohol Use: No     Comment: Last drink since two years ago   OB History   Grav Para Term Preterm Abortions TAB SAB Ect Mult Living                 Review of Systems  Gastrointestinal: Positive for nausea, diarrhea, blood in stool and hematochezia. Negative for abdominal pain.  All other systems reviewed and are negative.     Allergies  Dilaudid  Home Medications   Prior to Admission medications   Medication Sig Start Date End Date Taking? Authorizing Provider  azaTHIOprine (IMURAN) 50 MG tablet Take 50 mg by mouth at bedtime.     Historical Provider, MD  cephALEXin (KEFLEX) 500 MG capsule Take 1 capsule (500 mg total) by mouth 4 (four) times daily. 02/27/13    Hoy Morn, MD  clonazePAM (KLONOPIN) 0.5 MG tablet Take 1 mg by mouth 3 (three) times daily as needed. For anxiety    Historical Provider, MD  divalproex (DEPAKOTE) 500 MG DR tablet Take 500 mg by mouth daily.    Historical Provider, MD  fexofenadine (ALLEGRA) 180 MG tablet Take 180 mg by mouth daily.    Historical Provider, MD  folic acid (FOLVITE) 1 MG tablet Take 1 mg by mouth daily.    Historical Provider, MD  HYDROcodone-acetaminophen (NORCO) 5-325 MG per tablet Take 2 tablets by mouth every 4 (four) hours as needed. 06/05/13   Ernestina Patches, MD  HYDROcodone-acetaminophen (NORCO) 5-325 MG per tablet Take 2 tablets by mouth every 4 (four) hours as needed. 07/09/13   Ernestina Patches, MD  HYDROcodone-acetaminophen (NORCO/VICODIN) 5-325 MG per tablet Take 2 tablets by mouth every 4 (four) hours as needed for pain. 02/25/13   Malvin Johns, MD  hyoscyamine (LEVSIN, ANASPAZ) 0.125 MG tablet Take 0.125 mg by mouth every 4 (four) hours as needed.    Historical Provider, MD  lamoTRIgine (LAMICTAL) 200 MG tablet Take 200 mg by mouth daily.    Historical Provider, MD  levonorgestrel-ethinyl estradiol (ENPRESSE,TRIVORA) tablet Take 1 tablet by mouth daily.    Historical Provider, MD  levonorgestrel-ethinyl estradiol (NORDETTE) 0.15-30 MG-MCG tablet Take 1 tablet by mouth daily.  Historical Provider, MD  lurasidone (LATUDA) 80 MG TABS Take by mouth daily with breakfast.    Historical Provider, MD  nitrofurantoin, macrocrystal-monohydrate, (MACROBID) 100 MG capsule Take 100 mg by mouth 2 (two) times daily. Last dose tuesday    Historical Provider, MD  ondansetron (ZOFRAN ODT) 8 MG disintegrating tablet 24m ODT q4 hours prn nausea 02/25/13   MMalvin Johns MD  predniSONE (DELTASONE) 20 MG tablet 3 tabs po day one, then 2 po daily x 4 days 07/09/13   MErnestina Patches MD  promethazine (PHENERGAN) 25 MG suppository Place 1 suppository (25 mg total) rectally every 6 (six) hours as needed for nausea. 02/27/13    KHoy Morn MD  promethazine (PHENERGAN) 25 MG tablet Take 1 tablet (25 mg total) by mouth every 6 (six) hours as needed for nausea. 02/27/13   KHoy Morn MD  promethazine (PHENERGAN) 25 MG tablet Take 1 tablet (25 mg total) by mouth every 6 (six) hours as needed for nausea or vomiting. 07/09/13   MErnestina Patches MD  QUEtiapine (SEROQUEL) 400 MG tablet Take 400 mg by mouth at bedtime.    Historical Provider, MD  sulfaSALAzine (AZULFIDINE) 500 MG tablet Take 500 mg by mouth 4 (four) times daily.    Historical Provider, MD   BP 112/74  Pulse 76  Temp(Src) 98.4 F (36.9 C) (Oral)  Resp 16  Ht 5' 5"  (1.651 m)  Wt 135 lb (61.236 kg)  BMI 22.47 kg/m2  SpO2 98%  LMP 02/11/2014 Physical Exam  Nursing note and vitals reviewed. Constitutional: She is oriented to person, place, and time. She appears well-developed and well-nourished.  HENT:  Head: Normocephalic and atraumatic.  Eyes: Conjunctivae and EOM are normal. Pupils are equal, round, and reactive to light.  Neck: Normal range of motion.  Cardiovascular: Normal rate and intact distal pulses.   Pulmonary/Chest: Effort normal.  Abdominal: Soft. She exhibits no distension.  Musculoskeletal: Normal range of motion.  Neurological: She is alert and oriented to person, place, and time.  Skin: Skin is warm.  Psychiatric: She has a normal mood and affect.    ED Course  Procedures (including critical care time) Labs Review Labs Reviewed  COMPREHENSIVE METABOLIC PANEL - Abnormal; Notable for the following:    Potassium 3.2 (*)    Glucose, Bld 106 (*)    Alkaline Phosphatase 31 (*)    All other components within normal limits  URINALYSIS, ROUTINE W REFLEX MICROSCOPIC - Abnormal; Notable for the following:    APPearance CLOUDY (*)    Leukocytes, UA SMALL (*)    All other components within normal limits  URINE MICROSCOPIC-ADD ON - Abnormal; Notable for the following:    Squamous Epithelial / LPF FEW (*)    Bacteria, UA FEW (*)     All other components within normal limits  CBC WITH DIFFERENTIAL  PREGNANCY, URINE    Imaging Review Ct Abdomen Pelvis W Contrast  03/05/2014   CLINICAL DATA:  Abdominal pain and rectal bleeding. History of ulcerative colitis.  EXAM: CT ABDOMEN AND PELVIS WITH CONTRAST  TECHNIQUE: Multidetector CT imaging of the abdomen and pelvis was performed using the standard protocol following bolus administration of intravenous contrast.  CONTRAST:  515mOMNIPAQUE IOHEXOL 300 MG/ML SOLN, 10044mMNIPAQUE IOHEXOL 300 MG/ML SOLN  COMPARISON:  02/24/2013  FINDINGS: Lung bases are clear.  The liver, spleen, gallbladder, pancreas, adrenal glands, kidneys, abdominal aorta, inferior vena cava, and retroperitoneal lymph nodes are unremarkable. Small accessory spleen. Stomach and small bowel are normal  without evidence of distention or wall thickening. No free air or free fluid in the abdomen. Colon is mostly decompressed with scattered stool filling. No definite evidence of colonic wall thickening although decompression limits evaluation.  Pelvis: Rectosigmoid colon is decompressed. Suggestion of possible mild mural edema although this is unchanged since previous study. No pericolonic infiltration. The uterus and ovaries are not enlarged. No pelvic mass or lymphadenopathy. Appendix is normal. No free or loculated pelvic fluid collections. Bladder wall is not thickened. No destructive bone lesions. SI joints are patent.  IMPRESSION: Possible mild wall thickening in the rectosigmoid colon similar to previous study. Incomplete distention limits evaluation of the colon. No acute changes are otherwise demonstrated in the abdomen or pelvis.   Electronically Signed   By: Lucienne Capers M.D.   On: 03/05/2014 22:55     EKG Interpretation None     Results for orders placed during the hospital encounter of 03/05/14  CBC WITH DIFFERENTIAL      Result Value Ref Range   WBC 8.8  4.0 - 10.5 K/uL   RBC 4.12  3.87 - 5.11 MIL/uL    Hemoglobin 13.5  12.0 - 15.0 g/dL   HCT 39.1  36.0 - 46.0 %   MCV 94.9  78.0 - 100.0 fL   MCH 32.8  26.0 - 34.0 pg   MCHC 34.5  30.0 - 36.0 g/dL   RDW 12.6  11.5 - 15.5 %   Platelets 197  150 - 400 K/uL   Neutrophils Relative % 60  43 - 77 %   Neutro Abs 5.2  1.7 - 7.7 K/uL   Lymphocytes Relative 35  12 - 46 %   Lymphs Abs 3.1  0.7 - 4.0 K/uL   Monocytes Relative 4  3 - 12 %   Monocytes Absolute 0.4  0.1 - 1.0 K/uL   Eosinophils Relative 1  0 - 5 %   Eosinophils Absolute 0.1  0.0 - 0.7 K/uL   Basophils Relative 0  0 - 1 %   Basophils Absolute 0.0  0.0 - 0.1 K/uL  COMPREHENSIVE METABOLIC PANEL      Result Value Ref Range   Sodium 140  137 - 147 mEq/L   Potassium 3.2 (*) 3.7 - 5.3 mEq/L   Chloride 102  96 - 112 mEq/L   CO2 26  19 - 32 mEq/L   Glucose, Bld 106 (*) 70 - 99 mg/dL   BUN 11  6 - 23 mg/dL   Creatinine, Ser 0.70  0.50 - 1.10 mg/dL   Calcium 9.3  8.4 - 10.5 mg/dL   Total Protein 6.6  6.0 - 8.3 g/dL   Albumin 3.5  3.5 - 5.2 g/dL   AST 17  0 - 37 U/L   ALT 8  0 - 35 U/L   Alkaline Phosphatase 31 (*) 39 - 117 U/L   Total Bilirubin 0.3  0.3 - 1.2 mg/dL   GFR calc non Af Amer >90  >90 mL/min   GFR calc Af Amer >90  >90 mL/min   Anion gap 12  5 - 15  URINALYSIS, ROUTINE W REFLEX MICROSCOPIC      Result Value Ref Range   Color, Urine YELLOW  YELLOW   APPearance CLOUDY (*) CLEAR   Specific Gravity, Urine 1.017  1.005 - 1.030   pH 7.5  5.0 - 8.0   Glucose, UA NEGATIVE  NEGATIVE mg/dL   Hgb urine dipstick NEGATIVE  NEGATIVE   Bilirubin Urine NEGATIVE  NEGATIVE  Ketones, ur NEGATIVE  NEGATIVE mg/dL   Protein, ur NEGATIVE  NEGATIVE mg/dL   Urobilinogen, UA 0.2  0.0 - 1.0 mg/dL   Nitrite NEGATIVE  NEGATIVE   Leukocytes, UA SMALL (*) NEGATIVE  PREGNANCY, URINE      Result Value Ref Range   Preg Test, Ur NEGATIVE  NEGATIVE  URINE MICROSCOPIC-ADD ON      Result Value Ref Range   Squamous Epithelial / LPF FEW (*) RARE   WBC, UA 3-6  <3 WBC/hpf   Bacteria, UA FEW (*)  RARE   Ct Abdomen Pelvis W Contrast  03/05/2014   CLINICAL DATA:  Abdominal pain and rectal bleeding. History of ulcerative colitis.  EXAM: CT ABDOMEN AND PELVIS WITH CONTRAST  TECHNIQUE: Multidetector CT imaging of the abdomen and pelvis was performed using the standard protocol following bolus administration of intravenous contrast.  CONTRAST:  68m OMNIPAQUE IOHEXOL 300 MG/ML SOLN, 1079mOMNIPAQUE IOHEXOL 300 MG/ML SOLN  COMPARISON:  02/24/2013  FINDINGS: Lung bases are clear.  The liver, spleen, gallbladder, pancreas, adrenal glands, kidneys, abdominal aorta, inferior vena cava, and retroperitoneal lymph nodes are unremarkable. Small accessory spleen. Stomach and small bowel are normal without evidence of distention or wall thickening. No free air or free fluid in the abdomen. Colon is mostly decompressed with scattered stool filling. No definite evidence of colonic wall thickening although decompression limits evaluation.  Pelvis: Rectosigmoid colon is decompressed. Suggestion of possible mild mural edema although this is unchanged since previous study. No pericolonic infiltration. The uterus and ovaries are not enlarged. No pelvic mass or lymphadenopathy. Appendix is normal. No free or loculated pelvic fluid collections. Bladder wall is not thickened. No destructive bone lesions. SI joints are patent.  IMPRESSION: Possible mild wall thickening in the rectosigmoid colon similar to previous study. Incomplete distention limits evaluation of the colon. No acute changes are otherwise demonstrated in the abdomen or pelvis.   Electronically Signed   By: WiLucienne Capers.D.   On: 03/05/2014 22:55    MDM  Pt given iv fluids and pain medication.   Ct scan no perforation.  Pt given rx for prednisone taper, percocet and phenergan.   I advised pt to call her [facility] to be rechecked    Final diagnoses:  Ulcerative colitis, with rectal bleeding        LeFransico MeadowPA-C 03/05/14 [id number]

## 2014-03-07 NOTE — ED Provider Notes (Signed)
History/physical exam/procedure(s) were performed by non-physician practitioner and as supervising physician I was immediately available for consultation/collaboration. I have reviewed all notes and am in agreement with care and plan.   Shaune Pollack, MD 03/07/14 [phone]

## 2014-10-03 ENCOUNTER — Encounter (HOSPITAL_BASED_OUTPATIENT_CLINIC_OR_DEPARTMENT_OTHER): Payer: Self-pay

## 2014-10-03 ENCOUNTER — Emergency Department (HOSPITAL_BASED_OUTPATIENT_CLINIC_OR_DEPARTMENT_OTHER)
Admission: EM | Admit: 2014-10-03 | Discharge: 2014-10-03 | Disposition: A | Discharge status: Home / Self Care | Payer: BLUE CROSS/BLUE SHIELD | Attending: Emergency Medicine | Admitting: Emergency Medicine

## 2014-10-03 ENCOUNTER — Emergency Department (HOSPITAL_BASED_OUTPATIENT_CLINIC_OR_DEPARTMENT_OTHER): Payer: BLUE CROSS/BLUE SHIELD

## 2014-10-03 DIAGNOSIS — B9689 Other specified bacterial agents as the cause of diseases classified elsewhere: Secondary | ICD-10-CM

## 2014-10-03 DIAGNOSIS — Z793 Long term (current) use of hormonal contraceptives: Secondary | ICD-10-CM | POA: Insufficient documentation

## 2014-10-03 DIAGNOSIS — F319 Bipolar disorder, unspecified: Secondary | ICD-10-CM | POA: Diagnosis not present

## 2014-10-03 DIAGNOSIS — Z3202 Encounter for pregnancy test, result negative: Secondary | ICD-10-CM | POA: Insufficient documentation

## 2014-10-03 DIAGNOSIS — Z87891 Personal history of nicotine dependence: Secondary | ICD-10-CM | POA: Diagnosis not present

## 2014-10-03 DIAGNOSIS — G43909 Migraine, unspecified, not intractable, without status migrainosus: Secondary | ICD-10-CM | POA: Diagnosis not present

## 2014-10-03 DIAGNOSIS — F419 Anxiety disorder, unspecified: Secondary | ICD-10-CM | POA: Insufficient documentation

## 2014-10-03 DIAGNOSIS — Z79899 Other long term (current) drug therapy: Secondary | ICD-10-CM | POA: Diagnosis not present

## 2014-10-03 DIAGNOSIS — R1030 Lower abdominal pain, unspecified: Secondary | ICD-10-CM | POA: Diagnosis present

## 2014-10-03 DIAGNOSIS — R112 Nausea with vomiting, unspecified: Secondary | ICD-10-CM | POA: Insufficient documentation

## 2014-10-03 DIAGNOSIS — N760 Acute vaginitis: Secondary | ICD-10-CM | POA: Diagnosis not present

## 2014-10-03 DIAGNOSIS — R1084 Generalized abdominal pain: Secondary | ICD-10-CM

## 2014-10-03 LAB — WET PREP, GENITAL
Trich, Wet Prep: NONE SEEN
YEAST WET PREP: NONE SEEN

## 2014-10-03 LAB — COMPREHENSIVE METABOLIC PANEL
ALK PHOS: 32 U/L — AB (ref 39–117)
ALT: 12 U/L (ref 0–35)
AST: 18 U/L (ref 0–37)
Albumin: 3.9 g/dL (ref 3.5–5.2)
Anion gap: 7 (ref 5–15)
BUN: 9 mg/dL (ref 6–23)
CO2: 25 mmol/L (ref 19–32)
Calcium: 8.9 mg/dL (ref 8.4–10.5)
Chloride: 107 mmol/L (ref 96–112)
Creatinine, Ser: 0.83 mg/dL (ref 0.50–1.10)
GFR calc non Af Amer: >90 mL/min (ref >90)
GLUCOSE: 86 mg/dL (ref 70–99)
Potassium: 3.7 mmol/L (ref 3.5–5.1)
SODIUM: 139 mmol/L (ref 135–145)
Total Bilirubin: 0.5 mg/dL (ref 0.3–1.2)
Total Protein: 6.6 g/dL (ref 6.0–8.3)

## 2014-10-03 LAB — CBC
HCT: 41.6 % (ref 36.0–46.0)
Hemoglobin: 14.1 g/dL (ref 12.0–15.0)
MCH: 30.6 pg (ref 26.0–34.0)
MCHC: 33.9 g/dL (ref 30.0–36.0)
MCV: 90.2 fL (ref 78.0–100.0)
PLATELETS: 223 10*3/uL (ref 150–400)
RBC: 4.61 MIL/uL (ref 3.87–5.11)
RDW: 13.2 % (ref 11.5–15.5)
WBC: 5.2 10*3/uL (ref 4.0–10.5)

## 2014-10-03 LAB — URINALYSIS, ROUTINE W REFLEX MICROSCOPIC
BILIRUBIN URINE: NEGATIVE
Glucose, UA: NEGATIVE mg/dL
Hgb urine dipstick: NEGATIVE
KETONES UR: NEGATIVE mg/dL
LEUKOCYTES UA: NEGATIVE
Nitrite: NEGATIVE
PROTEIN: NEGATIVE mg/dL
Specific Gravity, Urine: 1.023 (ref 1.005–1.030)
Urobilinogen, UA: 0.2 mg/dL (ref 0.0–1.0)
pH: 6.5 (ref 5.0–8.0)

## 2014-10-03 LAB — LIPASE, BLOOD: LIPASE: 27 U/L (ref 11–59)

## 2014-10-03 LAB — PREGNANCY, URINE: Preg Test, Ur: NEGATIVE

## 2014-10-03 MED ORDER — METRONIDAZOLE 500 MG PO TABS: 500.0000 mg | ORAL_TABLET | Freq: Two times a day (BID) | ORAL | Status: DC

## 2014-10-03 MED ORDER — ONDANSETRON HCL 4 MG/2ML IJ SOLN
4.0000 mg | Freq: Once | INTRAMUSCULAR | Status: AC
  Administered 2014-10-03: 4 mg via INTRAVENOUS

## 2014-10-03 MED ORDER — SODIUM CHLORIDE 0.9 % IV BOLUS (SEPSIS)
1000.0000 mL | Freq: Once | INTRAVENOUS | Status: AC
Start: 2014-10-03 — End: 2014-10-03
  Administered 2014-10-03: 1000 mL via INTRAVENOUS

## 2014-10-03 MED ORDER — IOHEXOL 300 MG/ML  SOLN
50.0000 mL | Freq: Once | INTRAMUSCULAR | Status: AC | PRN
  Administered 2014-10-03: 50 mL via ORAL

## 2014-10-03 MED ORDER — MORPHINE SULFATE 4 MG/ML IJ SOLN
4.0000 mg | Freq: Once | INTRAMUSCULAR | Status: AC
Start: 2014-10-03 — End: 2014-10-03
  Administered 2014-10-03: 4 mg via INTRAVENOUS
  Filled 2014-10-03: qty 1

## 2014-10-03 MED ORDER — IOHEXOL 300 MG/ML  SOLN
100.0000 mL | Freq: Once | INTRAMUSCULAR | Status: AC | PRN
  Administered 2014-10-03: 100 mL via INTRAVENOUS

## 2014-10-03 MED ORDER — TRAMADOL HCL 50 MG PO TABS: 50.0000 mg | ORAL_TABLET | Freq: Four times a day (QID) | ORAL | Status: DC | PRN

## 2014-10-03 MED ORDER — ONDANSETRON HCL 4 MG/2ML IJ SOLN
INTRAMUSCULAR | Status: AC
  Filled 2014-10-03: qty 2

## 2014-10-03 MED ORDER — ONDANSETRON HCL 4 MG/2ML IJ SOLN
4.0000 mg | Freq: Once | INTRAMUSCULAR | Status: AC
  Administered 2014-10-03: 4 mg via INTRAVENOUS
  Filled 2014-10-03: qty 2

## 2014-10-03 MED ORDER — ONDANSETRON 4 MG PO TBDP: ORAL_TABLET | ORAL | Status: DC

## 2014-10-03 MED ORDER — PROMETHAZINE HCL 25 MG/ML IJ SOLN
25.0000 mg | Freq: Once | INTRAMUSCULAR | Status: AC
  Administered 2014-10-03: 25 mg via INTRAVENOUS
  Filled 2014-10-03: qty 1

## 2014-10-03 NOTE — ED Notes (Signed)
Patient called out and stated that she finished her contrast and then vomited about 200 cc.  States now her pain is a 10 of 10 and is associated with nausea.

## 2014-10-03 NOTE — ED Notes (Signed)
Pt states unable to void at this time. 

## 2014-10-03 NOTE — ED Notes (Signed)
C/o abd pain with bloody stools x 2-3 days-hx of colitis-states pain worse after sexual intercourse last night

## 2014-10-03 NOTE — ED Provider Notes (Signed)
CSN: [id number]     Arrival date & time 10/03/14  1118 History   First MD Initiated Contact with Patient 10/03/14 1205     Chief Complaint  Patient presents with  . Abdominal Pain     (Consider location/radiation/quality/duration/timing/severity/associated sxs/prior Treatment) HPI Comments: 19 year old female past medical history of ulcerative colitis, migraines, bipolar disorder and anxiety presenting with abdominal pain and bloody stools 3 days. Pain initially began across her lower abdomen which is normal for her typical episode of colitis flare, however last night when she was having intercourse with her boyfriend, she got a sharp, stabbing pain across the top of her abdomen which is not normal. The pain has been constant since, radiating throughout her abdomen, 7/10. No aggravating or alleviating factors. Admits to nausea with 2 episodes of nonbloody, nonbilious emesis last night. States bloody stools are normal for her, and she noticed "medium red blood" within her stool. Last ulcerative colitis flare was in August 2015. GI specialist at [facility]. Denies fever or chills. No sick contacts. Denies urinary symptoms, vaginal bleeding or discharge. She takes oral contraceptives and uses protection.  Patient is a 19 y.o. female presenting with abdominal pain. The history is provided by the patient.  Abdominal Pain Associated symptoms: diarrhea, nausea and vomiting     Past Medical History  Diagnosis Date  . Anxiety   . Colitis, ulcerative 2012    ulceration of colon  . Bipolar disorder   . Migraine    Past Surgical History  Procedure Laterality Date  . Wisdom tooth extraction  2007   Family History  Problem Relation Age of Onset  . Alcohol abuse Paternal Grandfather    History  Substance Use Topics  . Smoking status: Former Smoker -- 0.10 packs/day for 2 years  . Smokeless tobacco: Never Used  . Alcohol Use: Yes   OB History    No data available     Review of  Systems  Gastrointestinal: Positive for nausea, vomiting, abdominal pain, diarrhea and blood in stool.  All other systems reviewed and are negative.     Allergies  Dilaudid  Home Medications   Prior to Admission medications   Medication Sig Start Date End Date Taking? Authorizing Provider  azaTHIOprine (IMURAN) 50 MG tablet Take 50 mg by mouth at bedtime.     Historical Provider, MD  clonazePAM (KLONOPIN) 0.5 MG tablet Take 1 mg by mouth 3 (three) times daily as needed. For anxiety    Historical Provider, MD  divalproex (DEPAKOTE) 500 MG DR tablet Take 500 mg by mouth daily.    Historical Provider, MD  fexofenadine (ALLEGRA) 180 MG tablet Take 180 mg by mouth daily.    Historical Provider, MD  folic acid (FOLVITE) 1 MG tablet Take 1 mg by mouth daily.    Historical Provider, MD  lamoTRIgine (LAMICTAL) 200 MG tablet Take 200 mg by mouth daily.    Historical Provider, MD  levonorgestrel-ethinyl estradiol (ENPRESSE,TRIVORA) tablet Take 1 tablet by mouth daily.    Historical Provider, MD  levonorgestrel-ethinyl estradiol (NORDETTE) 0.15-30 MG-MCG tablet Take 1 tablet by mouth daily.    Historical Provider, MD  lurasidone (LATUDA) 80 MG TABS Take by mouth daily with breakfast.    Historical Provider, MD  metroNIDAZOLE (FLAGYL) 500 MG tablet Take 1 tablet (500 mg total) by mouth 2 (two) times daily. One po bid x 7 days 10/03/14   Carman Ching, PA-C  ondansetron (ZOFRAN ODT) 4 MG disintegrating tablet 32m ODT q4 hours prn  nausea/vomit 10/03/14   Vadim Centola M Natoya Viscomi, PA-C  QUEtiapine (SEROQUEL) 400 MG tablet Take 400 mg by mouth at bedtime.    Historical Provider, MD  sulfaSALAzine (AZULFIDINE) 500 MG tablet Take 500 mg by mouth 4 (four) times daily.    Historical Provider, MD  traMADol (ULTRAM) 50 MG tablet Take 1 tablet (50 mg total) by mouth every 6 (six) hours as needed. 10/03/14   Roseanna Koplin M Rosene Pilling, PA-C   BP 121/75 mmHg  Pulse 87  Temp(Src) 98.3 F (36.8 C) (Oral)  Resp 18  Ht 5' 5"  (1.651 m)  Wt  122 lb 4.8 oz (55.475 kg)  BMI 20.35 kg/m2  SpO2 100%  LMP 09/19/2014 (Approximate) Physical Exam  Constitutional: She is oriented to person, place, and time. She appears well-developed and well-nourished. No distress.  HENT:  Head: Normocephalic and atraumatic.  Mouth/Throat: Oropharynx is clear and moist.  Eyes: Conjunctivae and EOM are normal.  Neck: Normal range of motion. Neck supple.  Cardiovascular: Normal rate, regular rhythm and normal heart sounds.   Pulmonary/Chest: Effort normal and breath sounds normal. No respiratory distress.  Abdominal: Soft. She exhibits no distension. Bowel sounds are increased. There is generalized tenderness. There is no rigidity, no rebound, no guarding, no tenderness at McBurney's point and negative Murphy's sign.  Generalized tenderness, worse left upper quadrant and across lower abdomen. No peritoneal signs.  Genitourinary: Uterus normal. Cervix exhibits no motion tenderness, no discharge and no friability. Right adnexum displays no mass, no tenderness and no fullness. Left adnexum displays no mass, no tenderness and no fullness. No erythema, tenderness or bleeding in the vagina. No signs of injury around the vagina. No vaginal discharge found.  Musculoskeletal: Normal range of motion. She exhibits no edema.  Neurological: She is alert and oriented to person, place, and time. No sensory deficit.  Skin: Skin is warm and dry.  Psychiatric: She has a normal mood and affect. Her behavior is normal.  Nursing note and vitals reviewed.   ED Course  Procedures (including critical care time) Labs Review Labs Reviewed  WET PREP, GENITAL - Abnormal; Notable for the following:    Clue Cells Wet Prep HPF POC FEW (*)    WBC, Wet Prep HPF POC FEW (*)    All other components within normal limits  COMPREHENSIVE METABOLIC PANEL - Abnormal; Notable for the following:    Alkaline Phosphatase 32 (*)    All other components within normal limits  URINALYSIS,  ROUTINE W REFLEX MICROSCOPIC  PREGNANCY, URINE  CBC  LIPASE, BLOOD  GC/CHLAMYDIA PROBE AMP ([place])    Imaging Review Ct Abdomen Pelvis W Contrast  10/03/2014   CLINICAL DATA:  Abdominal pain, vomiting and history of ulcerative colitis.  EXAM: CT ABDOMEN AND PELVIS WITH CONTRAST  TECHNIQUE: Multidetector CT imaging of the abdomen and pelvis was performed using the standard protocol following bolus administration of intravenous contrast.  CONTRAST:  100 mL Omnipaque 300 IV  COMPARISON:  03/05/2014  FINDINGS: The liver, gallbladder, pancreas, spleen, adrenal glands and kidneys are within normal limits.  Bowel shows no evidence of obstruction or inflammation. No free air or free fluid is identified. There is no evidence of abscess.  No masses, enlarged lymph nodes or hernias are seen. The bladder, uterus and adnexal regions are unremarkable by CT. No vascular abnormalities are seen.  Bony structures show no abnormalities. The S1 level is transitional. Visualized lung bases are unremarkable.  IMPRESSION: No acute findings in the abdomen or pelvis.   Electronically Signed  By: Aletta Edouard M.D.   On: 10/03/2014 16:42     EKG Interpretation None      MDM   Final diagnoses:  Generalized abdominal pain  Non-intractable vomiting with nausea, vomiting of unspecified type  BV (bacterial vaginosis)   Nontoxic appearing, NAD. Afebrile, VSS. Abdomen soft with generalized tenderness, no peritoneal signs. Labs, urinalysis negative. Wet prep positive for clue cells. Will treat with Flagyl. Doubt PID, no CMT or adnexal tenderness. No discharge on exam. CT obtained given history of UC and continued pain. CT negative. Pain and nausea improved in ED, tolerating PO. Repeat abdominal exam with upper abdominal tenderness, improved from initial exam. Stable for d/c. F/u with PCP and gastroenterologist. Rx falgyl, tramadol and zofran. Return precautions given. Patient states understanding of treatment care  plan and is agreeable.  Carman Ching, PA-C 10/03/14 [place], MD 10/04/14 [phone]

## 2014-10-03 NOTE — Discharge Instructions (Signed)
Take tramadol as directed for pain. Take zofran as directed as needed for nausea. Take flagyl twice daily for 1 week, absolutely no alcohol contact with this medication. Follow up with your primary care doctor and gastroenterologist.  Abdominal Pain, Women Abdominal (stomach, pelvic, or belly) pain can be caused by many things. It is important to tell your doctor:  The location of the pain.  Does it come and go or is it present all the time?  Are there things that start the pain (eating certain foods, exercise)?  Are there other symptoms associated with the pain (fever, nausea, vomiting, diarrhea)? All of this is helpful to know when trying to find the cause of the pain. CAUSES   Stomach: virus or bacteria infection, or ulcer.  Intestine: appendicitis (inflamed appendix), regional ileitis (Crohn's disease), ulcerative colitis (inflamed colon), irritable bowel syndrome, diverticulitis (inflamed diverticulum of the colon), or cancer of the stomach or intestine.  Gallbladder disease or stones in the gallbladder.  Kidney disease, kidney stones, or infection.  Pancreas infection or cancer.  Fibromyalgia (pain disorder).  Diseases of the female organs:  Uterus: fibroid (non-cancerous) tumors or infection.  Fallopian tubes: infection or tubal pregnancy.  Ovary: cysts or tumors.  Pelvic adhesions (scar tissue).  Endometriosis (uterus lining tissue growing in the pelvis and on the pelvic organs).  Pelvic congestion syndrome (female organs filling up with blood just before the menstrual period).  Pain with the menstrual period.  Pain with ovulation (producing an egg).  Pain with an IUD (intrauterine device, birth control) in the uterus.  Cancer of the female organs.  Functional pain (pain not caused by a disease, may improve without treatment).  Psychological pain.  Depression. DIAGNOSIS  Your doctor will decide the seriousness of your pain by doing an  examination.  Blood tests.  X-rays.  Ultrasound.  CT scan (computed tomography, special type of X-ray).  MRI (magnetic resonance imaging).  Cultures, for infection.  Barium enema (dye inserted in the large intestine, to better view it with X-rays).  Colonoscopy (looking in intestine with a lighted tube).  Laparoscopy (minor surgery, looking in abdomen with a lighted tube).  Major abdominal exploratory surgery (looking in abdomen with a large incision). TREATMENT  The treatment will depend on the cause of the pain.   Many cases can be observed and treated at home.  Over-the-counter medicines recommended by your caregiver.  Prescription medicine.  Antibiotics, for infection.  Birth control pills, for painful periods or for ovulation pain.  Hormone treatment, for endometriosis.  Nerve blocking injections.  Physical therapy.  Antidepressants.  Counseling with a psychologist or psychiatrist.  Minor or major surgery. HOME CARE INSTRUCTIONS   Do not take laxatives, unless directed by your caregiver.  Take over-the-counter pain medicine only if ordered by your caregiver. Do not take aspirin because it can cause an upset stomach or bleeding.  Try a clear liquid diet (broth or water) as ordered by your caregiver. Slowly move to a bland diet, as tolerated, if the pain is related to the stomach or intestine.  Have a thermometer and take your temperature several times a day, and record it.  Bed rest and sleep, if it helps the pain.  Avoid sexual intercourse, if it causes pain.  Avoid stressful situations.  Keep your follow-up appointments and tests, as your caregiver orders.  If the pain does not go away with medicine or surgery, you may try:  Acupuncture.  Relaxation exercises (yoga, meditation).  Group therapy.  Counseling. [place]  CARE IF:   You notice certain foods cause stomach pain.  Your home care treatment is not helping your pain.  You  need stronger pain medicine.  You want your IUD removed.  You feel faint or lightheaded.  You develop nausea and vomiting.  You develop a rash.  You are having side effects or an allergy to your medicine. SEEK IMMEDIATE MEDICAL CARE IF:   Your pain does not go away or gets worse.  You have a fever.  Your pain is felt only in portions of the abdomen. The right side could possibly be appendicitis. The left lower portion of the abdomen could be colitis or diverticulitis.  You are passing blood in your stools (bright red or black tarry stools, with or without vomiting).  You have blood in your urine.  You develop chills, with or without a fever.  You pass out. MAKE SURE YOU:   Understand these instructions.  Will watch your condition.  Will get help right away if you are not doing well or get worse. Document Released: 04/26/2007 Document Revised: 11/13/2013 Document Reviewed: 05/16/2009 [facility] Patient Information 2015 [place], [place]. This information is not intended to replace advice given to you by your health care provider. Make sure you discuss any questions you have with your health care provider.  Bacterial Vaginosis Bacterial vaginosis is a vaginal infection that occurs when the normal balance of bacteria in the vagina is disrupted. It results from an overgrowth of certain bacteria. This is the most common vaginal infection in women of childbearing age. Treatment is important to prevent complications, especially in pregnant women, as it can cause a premature delivery. CAUSES  Bacterial vaginosis is caused by an increase in harmful bacteria that are normally present in smaller amounts in the vagina. Several different kinds of bacteria can cause bacterial vaginosis. However, the reason that the condition develops is not fully understood. RISK FACTORS Certain activities or behaviors can put you at an increased risk of developing bacterial vaginosis, including:  Having a  new sex partner or multiple sex partners.  Douching.  Using an intrauterine device (IUD) for contraception. Women do not get bacterial vaginosis from toilet seats, bedding, swimming pools, or contact with objects around them. SIGNS AND SYMPTOMS  Some women with bacterial vaginosis have no signs or symptoms. Common symptoms include:  Grey vaginal discharge.  A fishlike odor with discharge, especially after sexual intercourse.  Itching or burning of the vagina and vulva.  Burning or pain with urination. DIAGNOSIS  Your health care provider will take a medical history and examine the vagina for signs of bacterial vaginosis. A sample of vaginal fluid may be taken. Your health care provider will look at this sample under a microscope to check for bacteria and abnormal cells. A vaginal pH test may also be done.  TREATMENT  Bacterial vaginosis may be treated with antibiotic medicines. These may be given in the form of a pill or a vaginal cream. A second round of antibiotics may be prescribed if the condition comes back after treatment.  HOME CARE INSTRUCTIONS   Only take over-the-counter or prescription medicines as directed by your health care provider.  If antibiotic medicine was prescribed, take it as directed. Make sure you finish it even if you start to feel better.  Do not have sex until treatment is completed.  Tell all sexual partners that you have a vaginal infection. They should see their health care provider and be treated if they have problems, such as  a mild rash or itching.  Practice safe sex by using condoms and only having one sex partner. SEEK MEDICAL CARE IF:   Your symptoms are not improving after 3 days of treatment.  You have increased discharge or pain.  You have a fever. MAKE SURE YOU:   Understand these instructions.  Will watch your condition.  Will get help right away if you are not doing well or get worse. FOR MORE INFORMATION  Centers for Disease  Control and Prevention, Division of STD Prevention: [employer] American Sexual Health Association (ASHA): www.ashastd.org  Document Released: 06/29/2005 Document Revised: 04/19/2013 Document Reviewed: 02/08/2013 [facility] Patient Information 2015 [place], [place]. This information is not intended to replace advice given to you by your health care provider. Make sure you discuss any questions you have with your health care provider.

## 2014-10-04 LAB — GC/CHLAMYDIA PROBE AMP (~~LOC~~) NOT AT ARMC
Chlamydia: NEGATIVE
NEISSERIA GONORRHEA: NEGATIVE

## 2014-11-06 ENCOUNTER — Emergency Department (HOSPITAL_BASED_OUTPATIENT_CLINIC_OR_DEPARTMENT_OTHER)
Admission: EM | Admit: 2014-11-06 | Discharge: 2014-11-06 | Disposition: A | Discharge status: Home / Self Care | Payer: BLUE CROSS/BLUE SHIELD | Attending: Emergency Medicine | Admitting: Emergency Medicine

## 2014-11-06 ENCOUNTER — Encounter (HOSPITAL_BASED_OUTPATIENT_CLINIC_OR_DEPARTMENT_OTHER): Payer: Self-pay | Admitting: Emergency Medicine

## 2014-11-06 DIAGNOSIS — F319 Bipolar disorder, unspecified: Secondary | ICD-10-CM | POA: Insufficient documentation

## 2014-11-06 DIAGNOSIS — F419 Anxiety disorder, unspecified: Secondary | ICD-10-CM | POA: Diagnosis not present

## 2014-11-06 DIAGNOSIS — R51 Headache: Secondary | ICD-10-CM | POA: Diagnosis present

## 2014-11-06 DIAGNOSIS — Z87891 Personal history of nicotine dependence: Secondary | ICD-10-CM | POA: Diagnosis not present

## 2014-11-06 DIAGNOSIS — G43909 Migraine, unspecified, not intractable, without status migrainosus: Secondary | ICD-10-CM | POA: Insufficient documentation

## 2014-11-06 DIAGNOSIS — Z79899 Other long term (current) drug therapy: Secondary | ICD-10-CM | POA: Insufficient documentation

## 2014-11-06 DIAGNOSIS — R519 Headache, unspecified: Secondary | ICD-10-CM

## 2014-11-06 MED ORDER — SODIUM CHLORIDE 0.9 % IV BOLUS (SEPSIS)
1000.0000 mL | Freq: Once | INTRAVENOUS | Status: AC
  Administered 2014-11-06: 1000 mL via INTRAVENOUS

## 2014-11-06 MED ORDER — KETOROLAC TROMETHAMINE 30 MG/ML IJ SOLN
30.0000 mg | Freq: Once | INTRAMUSCULAR | Status: AC
  Administered 2014-11-06: 30 mg via INTRAVENOUS
  Filled 2014-11-06: qty 1

## 2014-11-06 MED ORDER — DIPHENHYDRAMINE HCL 50 MG/ML IJ SOLN
25.0000 mg | Freq: Once | INTRAMUSCULAR | Status: AC
  Administered 2014-11-06: 25 mg via INTRAVENOUS
  Filled 2014-11-06: qty 1

## 2014-11-06 MED ORDER — METOCLOPRAMIDE HCL 5 MG/ML IJ SOLN
10.0000 mg | Freq: Once | INTRAMUSCULAR | Status: AC
  Administered 2014-11-06: 10 mg via INTRAVENOUS
  Filled 2014-11-06: qty 2

## 2014-11-06 NOTE — ED Notes (Signed)
Patient states that she was at the [facility] today and since she has had a HA. She has tried Tylenol but has not helped her pain

## 2014-11-06 NOTE — ED Provider Notes (Signed)
CSN: [id number]     Arrival date & time 11/06/14  0236 History   First MD Initiated Contact with Patient 11/06/14 0246     Chief Complaint  Patient presents with  . Headache     (Consider location/radiation/quality/duration/timing/severity/associated sxs/prior Treatment) HPI  This is a 19 year old female with history of migraines and bipolar disorder. She is here with a headache that began yesterday evening about 7 PM. The onset was gradual and it has now become severe. It started on the right side but has generalized. She describes the pain as like previous migraines. She has taken Tylenol without relief. The pain is associated with photophobia, nausea and vomiting. There is no focal numbness or weakness.  Past Medical History  Diagnosis Date  . Anxiety   . Colitis, ulcerative 2012    ulceration of colon  . Bipolar disorder   . Migraine    Past Surgical History  Procedure Laterality Date  . Wisdom tooth extraction  2007   Family History  Problem Relation Age of Onset  . Alcohol abuse Paternal Grandfather    History  Substance Use Topics  . Smoking status: Former Smoker -- 0.10 packs/day for 2 years  . Smokeless tobacco: Never Used  . Alcohol Use: Yes   OB History    No data available     Review of Systems  All other systems reviewed and are negative.   Allergies  Dilaudid  Home Medications   Prior to Admission medications   Medication Sig Start Date End Date Taking? Authorizing Provider  azaTHIOprine (IMURAN) 50 MG tablet Take 50 mg by mouth at bedtime.     Historical Provider, MD  clonazePAM (KLONOPIN) 0.5 MG tablet Take 1 mg by mouth 3 (three) times daily as needed. For anxiety    Historical Provider, MD  divalproex (DEPAKOTE) 500 MG DR tablet Take 500 mg by mouth daily.    Historical Provider, MD  fexofenadine (ALLEGRA) 180 MG tablet Take 180 mg by mouth daily.    Historical Provider, MD  folic acid (FOLVITE) 1 MG tablet Take 1 mg by mouth daily.     Historical Provider, MD  lamoTRIgine (LAMICTAL) 200 MG tablet Take 200 mg by mouth daily.    Historical Provider, MD  levonorgestrel-ethinyl estradiol (ENPRESSE,TRIVORA) tablet Take 1 tablet by mouth daily.    Historical Provider, MD  levonorgestrel-ethinyl estradiol (NORDETTE) 0.15-30 MG-MCG tablet Take 1 tablet by mouth daily.    Historical Provider, MD  lurasidone (LATUDA) 80 MG TABS Take by mouth daily with breakfast.    Historical Provider, MD  metroNIDAZOLE (FLAGYL) 500 MG tablet Take 1 tablet (500 mg total) by mouth 2 (two) times daily. One po bid x 7 days 10/03/14   Carman Ching, PA-C  ondansetron (ZOFRAN ODT) 4 MG disintegrating tablet 52m ODT q4 hours prn nausea/vomit 10/03/14   Robyn M Hess, PA-C  QUEtiapine (SEROQUEL) 400 MG tablet Take 400 mg by mouth at bedtime.    Historical Provider, MD  sulfaSALAzine (AZULFIDINE) 500 MG tablet Take 500 mg by mouth 4 (four) times daily.    Historical Provider, MD  traMADol (ULTRAM) 50 MG tablet Take 1 tablet (50 mg total) by mouth every 6 (six) hours as needed. 10/03/14   Robyn M Hess, PA-C   BP 117/57 mmHg  Pulse 81  Temp(Src) 97.4 F (36.3 C) (Oral)  Resp 15  Ht 5' 5"  (1.651 m)  Wt 125 lb (56.7 kg)  BMI 20.80 kg/m2  SpO2 100%  LMP 10/16/2014  Physical Exam  General: Well-developed, well-nourished female in no acute distress; appearance consistent with age of record HENT: normocephalic; atraumatic Eyes: pupils equal, round and reactive to light; extraocular muscles intact Neck: supple Heart: regular rate and rhythm Lungs: clear to auscultation bilaterally Abdomen: soft; nondistended; nontender; bowel sounds present Extremities: No deformity; full range of motion; pulses normal Neurologic: Awake, alert and oriented; motor function intact in all extremities and symmetric; no facial droop Skin: Warm and dry Psychiatric: Flat affect; tearful    ED Course  Procedures (including critical care time)   MDM  4:16 AM Patient feeling  much better after IV fluids and medications.  Shanon Rosser, MD 11/06/14 [phone]

## 2015-10-29 DIAGNOSIS — F3175 Bipolar disorder, in partial remission, most recent episode depressed: Secondary | ICD-10-CM | POA: Diagnosis not present

## 2015-12-24 DIAGNOSIS — F3175 Bipolar disorder, in partial remission, most recent episode depressed: Secondary | ICD-10-CM | POA: Diagnosis not present

## 2016-03-26 ENCOUNTER — Emergency Department (HOSPITAL_BASED_OUTPATIENT_CLINIC_OR_DEPARTMENT_OTHER)
Admission: EM | Admit: 2016-03-26 | Discharge: 2016-03-27 | Disposition: A | Discharge status: Home / Self Care | Payer: BLUE CROSS/BLUE SHIELD | Attending: Emergency Medicine | Admitting: Emergency Medicine

## 2016-03-26 ENCOUNTER — Encounter (HOSPITAL_BASED_OUTPATIENT_CLINIC_OR_DEPARTMENT_OTHER): Payer: Self-pay | Admitting: Emergency Medicine

## 2016-03-26 DIAGNOSIS — K5190 Ulcerative colitis, unspecified, without complications: Secondary | ICD-10-CM | POA: Insufficient documentation

## 2016-03-26 DIAGNOSIS — R935 Abnormal findings on diagnostic imaging of other abdominal regions, including retroperitoneum: Secondary | ICD-10-CM | POA: Diagnosis not present

## 2016-03-26 DIAGNOSIS — Z87891 Personal history of nicotine dependence: Secondary | ICD-10-CM | POA: Diagnosis not present

## 2016-03-26 DIAGNOSIS — K625 Hemorrhage of anus and rectum: Secondary | ICD-10-CM | POA: Diagnosis present

## 2016-03-26 NOTE — ED Triage Notes (Signed)
Patient states that she has had an increase in her rectal bleeding, associated with her ulcerative colitis. Patient states that she had a HA  - with feeling drained and weak all over. Also feels like she is getting paler

## 2016-03-27 ENCOUNTER — Encounter (HOSPITAL_BASED_OUTPATIENT_CLINIC_OR_DEPARTMENT_OTHER): Payer: Self-pay | Admitting: Emergency Medicine

## 2016-03-27 ENCOUNTER — Emergency Department (HOSPITAL_BASED_OUTPATIENT_CLINIC_OR_DEPARTMENT_OTHER): Payer: BLUE CROSS/BLUE SHIELD

## 2016-03-27 DIAGNOSIS — R935 Abnormal findings on diagnostic imaging of other abdominal regions, including retroperitoneum: Secondary | ICD-10-CM | POA: Diagnosis not present

## 2016-03-27 LAB — COMPREHENSIVE METABOLIC PANEL
ALT: 14 U/L (ref 14–54)
AST: 18 U/L (ref 15–41)
Albumin: 3.8 g/dL (ref 3.5–5.0)
Alkaline Phosphatase: 26 U/L — ABNORMAL LOW (ref 38–126)
Anion gap: 7 (ref 5–15)
BUN: 8 mg/dL (ref 6–20)
CHLORIDE: 108 mmol/L (ref 101–111)
CO2: 23 mmol/L (ref 22–32)
Calcium: 8.6 mg/dL — ABNORMAL LOW (ref 8.9–10.3)
Creatinine, Ser: 0.58 mg/dL (ref 0.44–1.00)
GFR calc non Af Amer: >60 mL/min (ref >60)
Glucose, Bld: 103 mg/dL — ABNORMAL HIGH (ref 65–99)
Potassium: 3.2 mmol/L — ABNORMAL LOW (ref 3.5–5.1)
Sodium: 138 mmol/L (ref 135–145)
Total Bilirubin: 0.7 mg/dL (ref 0.3–1.2)
Total Protein: 6.2 g/dL — ABNORMAL LOW (ref 6.5–8.1)

## 2016-03-27 LAB — CBC WITH DIFFERENTIAL/PLATELET
Basophils Absolute: 0 10*3/uL (ref 0.0–0.1)
Basophils Relative: 0 %
Eosinophils Absolute: 0 10*3/uL (ref 0.0–0.7)
Eosinophils Relative: 0 %
HEMATOCRIT: 41.7 % (ref 36.0–46.0)
Hemoglobin: 14.3 g/dL (ref 12.0–15.0)
LYMPHS ABS: 2.8 10*3/uL (ref 0.7–4.0)
Lymphocytes Relative: 30 %
MCH: 31.4 pg (ref 26.0–34.0)
MCHC: 34.3 g/dL (ref 30.0–36.0)
MCV: 91.6 fL (ref 78.0–100.0)
MONO ABS: 0.5 10*3/uL (ref 0.1–1.0)
MONOS PCT: 5 %
NEUTROS ABS: 6.1 10*3/uL (ref 1.7–7.7)
Neutrophils Relative %: 65 %
Platelets: 293 10*3/uL (ref 150–400)
RBC: 4.55 MIL/uL (ref 3.87–5.11)
RDW: 15.5 % (ref 11.5–15.5)
WBC: 9.4 10*3/uL (ref 4.0–10.5)

## 2016-03-27 LAB — URINE MICROSCOPIC-ADD ON: WBC, UA: NONE SEEN WBC/hpf (ref 0–5)

## 2016-03-27 LAB — URINALYSIS, ROUTINE W REFLEX MICROSCOPIC
BILIRUBIN URINE: NEGATIVE
GLUCOSE, UA: NEGATIVE mg/dL
Ketones, ur: NEGATIVE mg/dL
Leukocytes, UA: NEGATIVE
Nitrite: NEGATIVE
PH: 6.5 (ref 5.0–8.0)
Protein, ur: NEGATIVE mg/dL
SPECIFIC GRAVITY, URINE: 1.004 — AB (ref 1.005–1.030)

## 2016-03-27 LAB — PREGNANCY, URINE: Preg Test, Ur: NEGATIVE

## 2016-03-27 LAB — OCCULT BLOOD X 1 CARD TO LAB, STOOL: Fecal Occult Bld: POSITIVE — AB

## 2016-03-27 MED ORDER — METOCLOPRAMIDE HCL 5 MG/ML IJ SOLN
10.0000 mg | Freq: Once | INTRAMUSCULAR | Status: AC
  Administered 2016-03-27: 10 mg via INTRAVENOUS
  Filled 2016-03-27: qty 2

## 2016-03-27 MED ORDER — METHYLPREDNISOLONE SODIUM SUCC 125 MG IJ SOLR
125.0000 mg | Freq: Once | INTRAMUSCULAR | Status: AC
  Administered 2016-03-27: 125 mg via INTRAVENOUS
  Filled 2016-03-27: qty 2

## 2016-03-27 MED ORDER — SODIUM CHLORIDE 0.9 % IV BOLUS (SEPSIS)
1000.0000 mL | Freq: Once | INTRAVENOUS | Status: AC
  Administered 2016-03-27: 1000 mL via INTRAVENOUS

## 2016-03-27 MED ORDER — METRONIDAZOLE 500 MG PO TABS: 500.0000 mg | ORAL_TABLET | Freq: Three times a day (TID) | ORAL | 0 refills | Status: DC

## 2016-03-27 MED ORDER — IOPAMIDOL (ISOVUE-300) INJECTION 61%
100.0000 mL | Freq: Once | INTRAVENOUS | Status: AC | PRN
  Administered 2016-03-27: 100 mL via INTRAVENOUS

## 2016-03-27 MED ORDER — PREDNISONE 20 MG PO TABS: ORAL_TABLET | ORAL | 0 refills | Status: DC

## 2016-03-27 MED ORDER — METRONIDAZOLE 500 MG PO TABS
500.0000 mg | ORAL_TABLET | Freq: Once | ORAL | Status: AC
  Administered 2016-03-27: 500 mg via ORAL
  Filled 2016-03-27: qty 1

## 2016-03-27 MED ORDER — KETOROLAC TROMETHAMINE 30 MG/ML IJ SOLN
30.0000 mg | Freq: Once | INTRAMUSCULAR | Status: AC
  Administered 2016-03-27: 30 mg via INTRAVENOUS
  Filled 2016-03-27: qty 1

## 2016-03-27 MED ORDER — ONDANSETRON HCL 4 MG/2ML IJ SOLN
4.0000 mg | Freq: Once | INTRAMUSCULAR | Status: AC
  Administered 2016-03-27: 4 mg via INTRAVENOUS
  Filled 2016-03-27: qty 2

## 2016-03-27 MED ORDER — DIPHENHYDRAMINE HCL 50 MG/ML IJ SOLN
12.5000 mg | Freq: Once | INTRAMUSCULAR | Status: AC
  Administered 2016-03-27: 12.5 mg via INTRAVENOUS
  Filled 2016-03-27: qty 1

## 2016-03-27 MED ORDER — FENTANYL CITRATE (PF) 100 MCG/2ML IJ SOLN
100.0000 ug | Freq: Once | INTRAMUSCULAR | Status: AC
  Administered 2016-03-27: 100 ug via INTRAVENOUS
  Filled 2016-03-27: qty 2

## 2016-03-27 MED ORDER — MORPHINE SULFATE (PF) 4 MG/ML IV SOLN
4.0000 mg | Freq: Once | INTRAVENOUS | Status: AC
Start: 2016-03-27 — End: 2016-03-27
  Administered 2016-03-27: 4 mg via INTRAVENOUS
  Filled 2016-03-27: qty 1

## 2016-03-27 MED ORDER — TRAMADOL HCL 50 MG PO TABS
50.0000 mg | ORAL_TABLET | Freq: Four times a day (QID) | ORAL | 0 refills | Status: DC | PRN
Start: 2016-03-27 — End: 2017-07-31

## 2016-03-27 NOTE — ED Notes (Signed)
Patient transported to CT 

## 2016-03-27 NOTE — ED Provider Notes (Addendum)
[facility] Provider Note   CSN: [id number] Arrival date & time: 03/26/16  2332     History   Chief Complaint Chief Complaint  Patient presents with  . Rectal Bleeding    HPI Kimber[facility] Caldwell is a 20 y.o. female.  The history is provided by the patient.  Rectal Bleeding  Quality:  Bright red Amount:  Scant Duration:  2 weeks Timing:  Intermittent Chronicity:  Recurrent Context: not anal fissures and not constipation   Similar prior episodes: yes   Relieved by:  Nothing Worsened by:  Nothing Ineffective treatments:  None tried Associated symptoms: no loss of consciousness and no vomiting   Risk factors: hx of IBD   Risk factors: no NSAID use   Patient with a h/o UC complains of 2 weeks of small red rectal bleeding and her typical abdominal pain.  No emesis.  Able to tolerate PO.  Stable she called GI clinic at [facility] and was told she could not be seen until January.  States she has not had travel and has not missed any doses of her medications.  Also is having her typical migraine.    Past Medical History:  Diagnosis Date  . Anxiety   . Bipolar disorder ([place])   . Colitis, ulcerative ([place]) 2012   ulceration of colon  . Migraine     Patient Active Problem List   Diagnosis Date Noted  . Cannabis abuse 07/15/2011  . Bipolar I disorder ([place])   . Generalized anxiety disorder   . Mood disorder ([place]) 06/17/2011    Past Surgical History:  Procedure Laterality Date  . [place] EXTRACTION  2007    OB History    No data available       Home Medications    Prior to Admission medications   Medication Sig Start Date End Date Taking? Authorizing Provider  azaTHIOprine (IMURAN) 50 MG tablet Take 50 mg by mouth at bedtime.     Historical Provider, MD  clonazePAM (KLONOPIN) 0.5 MG tablet Take 1 mg by mouth 3 (three) times daily as needed. For anxiety    Historical Provider, MD  divalproex (DEPAKOTE) 500 MG DR tablet Take 500 mg by mouth daily.     Historical Provider, MD  fexofenadine (ALLEGRA) 180 MG tablet Take 180 mg by mouth daily.    Historical Provider, MD  folic acid (FOLVITE) 1 MG tablet Take 1 mg by mouth daily.    Historical Provider, MD  lamoTRIgine (LAMICTAL) 200 MG tablet Take 200 mg by mouth daily.    Historical Provider, MD  levonorgestrel-ethinyl estradiol (ENPRESSE,TRIVORA) tablet Take 1 tablet by mouth daily.    Historical Provider, MD  levonorgestrel-ethinyl estradiol (NORDETTE) 0.15-30 MG-MCG tablet Take 1 tablet by mouth daily.    Historical Provider, MD  lurasidone (LATUDA) 80 MG TABS Take by mouth daily with breakfast.    Historical Provider, MD  metroNIDAZOLE (FLAGYL) 500 MG tablet Take 1 tablet (500 mg total) by mouth 2 (two) times daily. One po bid x 7 days 10/03/14   Carman Ching, PA-C  ondansetron (ZOFRAN ODT) 4 MG disintegrating tablet 24m ODT q4 hours prn nausea/vomit 10/03/14   Robyn M Hess, PA-C  QUEtiapine (SEROQUEL) 400 MG tablet Take 400 mg by mouth at bedtime.    Historical Provider, MD  sulfaSALAzine (AZULFIDINE) 500 MG tablet Take 500 mg by mouth 4 (four) times daily.    Historical Provider, MD  traMADol (ULTRAM) 50 MG tablet Take 1 tablet (50 mg total) by mouth  every 6 (six) hours as needed. 10/03/14   Carman Ching, PA-C    Family History Family History  Problem Relation Age of Onset  . Alcohol abuse Paternal Grandfather     Social History Social History  Substance Use Topics  . Smoking status: Former Smoker    Packs/day: 0.10    Years: 2.00  . Smokeless tobacco: Never Used  . Alcohol use Yes     Allergies   Dilaudid [hydromorphone hcl]   Review of Systems Review of Systems  Respiratory: Negative for shortness of breath.   Cardiovascular: Negative for chest pain, palpitations and leg swelling.  Gastrointestinal: Positive for blood in stool and hematochezia. Negative for constipation, nausea and vomiting.  Genitourinary: Negative for dysuria.  Neurological: Negative for loss of  consciousness.  All other systems reviewed and are negative.    Physical Exam Updated Vital Signs BP 127/65   Pulse 94   Temp 100.9 F (38.3 C) (Oral)   Resp 13   Ht 5' 5"  (1.651 m)   Wt 115 lb (52.2 kg)   LMP 02/11/2016   SpO2 97%   BMI 19.14 kg/m   Physical Exam  Constitutional: She is oriented to person, place, and time. She appears well-developed and well-nourished. No distress.  Is smiling and laughing in the room on history and exam  HENT:  Head: Normocephalic and atraumatic.  Nose: Nose normal.  Mouth/Throat: No oropharyngeal exudate.  Eyes: EOM are normal. Pupils are equal, round, and reactive to light.  Neck: Normal range of motion. Neck supple.  Cardiovascular: Normal rate and regular rhythm.   Pulmonary/Chest: Effort normal and breath sounds normal. She has no wheezes. She has no rales.  Abdominal: Soft. Bowel sounds are normal. She exhibits no mass. There is no tenderness. There is no rebound and no guarding.  Genitourinary: Rectal exam shows guaiac positive stool.  Genitourinary Comments: Chaperone present.  No gross blood has anal fissure at 6 oclock position  Musculoskeletal: Normal range of motion.  Neurological: She is alert and oriented to person, place, and time.  Skin: Capillary refill takes less than 2 seconds.  Psychiatric: Her mood appears anxious. Her affect is labile.     ED Treatments / Results   Vitals:   03/27/16 0126 03/27/16 0200  BP: 107/56 (!) 90/46  Pulse: 78 76  Resp: 14 16  Temp:     Results for orders placed or performed during the hospital encounter of 03/26/16  CBC with Differential/Platelet  Result Value Ref Range   WBC 9.4 4.0 - 10.5 K/uL   RBC 4.55 3.87 - 5.11 MIL/uL   Hemoglobin 14.3 12.0 - 15.0 g/dL   HCT 41.7 36.0 - 46.0 %   MCV 91.6 78.0 - 100.0 fL   MCH 31.4 26.0 - 34.0 pg   MCHC 34.3 30.0 - 36.0 g/dL   RDW 15.5 11.5 - 15.5 %   Platelets 293 150 - 400 K/uL   Neutrophils Relative % 65 %   Neutro Abs 6.1 1.7 -  7.7 K/uL   Lymphocytes Relative 30 %   Lymphs Abs 2.8 0.7 - 4.0 K/uL   Monocytes Relative 5 %   Monocytes Absolute 0.5 0.1 - 1.0 K/uL   Eosinophils Relative 0 %   Eosinophils Absolute 0.0 0.0 - 0.7 K/uL   Basophils Relative 0 %   Basophils Absolute 0.0 0.0 - 0.1 K/uL  Urinalysis, Routine w reflex microscopic (not at [facility])  Result Value Ref Range   Color, Urine YELLOW YELLOW  APPearance CLEAR CLEAR   Specific Gravity, Urine 1.004 (L) 1.005 - 1.030   pH 6.5 5.0 - 8.0   Glucose, UA NEGATIVE NEGATIVE mg/dL   Hgb urine dipstick SMALL (A) NEGATIVE   Bilirubin Urine NEGATIVE NEGATIVE   Ketones, ur NEGATIVE NEGATIVE mg/dL   Protein, ur NEGATIVE NEGATIVE mg/dL   Nitrite NEGATIVE NEGATIVE   Leukocytes, UA NEGATIVE NEGATIVE  Pregnancy, urine  Result Value Ref Range   Preg Test, Ur NEGATIVE NEGATIVE  Urine microscopic-add on  Result Value Ref Range   Squamous Epithelial / LPF 0-5 (A) NONE SEEN   WBC, UA NONE SEEN 0 - 5 WBC/hpf   RBC / HPF 0-5 0 - 5 RBC/hpf   Bacteria, UA RARE (A) NONE SEEN  Comprehensive metabolic panel  Result Value Ref Range   Sodium 138 135 - 145 mmol/L   Potassium 3.2 (L) 3.5 - 5.1 mmol/L   Chloride 108 101 - 111 mmol/L   CO2 23 22 - 32 mmol/L   Glucose, Bld 103 (H) 65 - 99 mg/dL   BUN 8 6 - 20 mg/dL   Creatinine, Ser 0.58 0.44 - 1.00 mg/dL   Calcium 8.6 (L) 8.9 - 10.3 mg/dL   Total Protein 6.2 (L) 6.5 - 8.1 g/dL   Albumin 3.8 3.5 - 5.0 g/dL   AST 18 15 - 41 U/L   ALT 14 14 - 54 U/L   Alkaline Phosphatase 26 (L) 38 - 126 U/L   Total Bilirubin 0.7 0.3 - 1.2 mg/dL   GFR calc non Af Amer >60 >60 mL/min   GFR calc Af Amer >60 >60 mL/min   Anion gap 7 5 - 15  Occult blood card to lab, stool Provider will collect  Result Value Ref Range   Fecal Occult Bld POSITIVE (A) NEGATIVE   Ct Abdomen Pelvis W Contrast  Result Date: 03/27/2016 CLINICAL DATA:  Rectal bleeding for 2 weeks. History of ulcerative colitis. EXAM: CT ABDOMEN AND PELVIS WITH CONTRAST  TECHNIQUE: Multidetector CT imaging of the abdomen and pelvis was performed using the standard protocol following bolus administration of intravenous contrast. CONTRAST:  168m ISOVUE-300 IOPAMIDOL (ISOVUE-300) INJECTION 61% COMPARISON:  CT abdomen pelvis 10/03/2014 FINDINGS: Lower chest: No pulmonary nodules. No visible pleural or pericardial effusion. Hepatobiliary: Normal hepatic size and contours without focal liver lesion. No perihepatic ascites. No intra- or extrahepatic biliary dilatation. Normal gallbladder. Pancreas: Normal pancreatic contours and enhancement. No peripancreatic fluid collection or pancreatic ductal dilatation. Spleen: Normal. Adrenals/Urinary Tract: Normal adrenal glands. No hydronephrosis or solid renal mass. Stomach/Bowel: There is mild wall thickening of the sigmoid colon. There is mild nearby inflammatory stranding. There is no small bowel dilatation or other evidence of obstruction. No abdominal fluid collection. The appendix is normal. Vascular/Lymphatic: Normal course and caliber of the major abdominal vessels. No abdominal or pelvic adenopathy. Reproductive: Normal uterus and ovaries. Musculoskeletal: No lytic or blastic osseous lesion. Normal visualized extrathoracic and extraperitoneal soft tissues. Other: No contributory non-categorized findings. IMPRESSION: 1. Mild wall thickening of the sigmoid colon with a mild degree of nearby fat stranding, possibly indicating a segment of active colitis. 2. No intra-abdominal fluid collection or evidence of obstruction. No other evidence of active inflammation. Electronically Signed   By: KUlyses JarredM.D.   On: 03/27/2016 03:04    Radiology No results found.  Procedures Procedures (including critical care time) Complained of an ongoing headache worse post narcotic pain medication.  Has started migraine cocktail.  Given 2 weeks of rectal bleeding without change  in blood count nor signs of dehydration on labs I find this reassuring.   I believe the initial tachycardia was due largely to her anxiety as she appears anxious and moreover she states her resting HR is 120.  She is much better than that post medication.   Medications Ordered in ED Medications  methylPREDNISolone sodium succinate (SOLU-MEDROL) 125 mg/2 mL injection 125 mg (not administered)  morphine 4 MG/ML injection 4 mg (not administered)  sodium chloride 0.9 % bolus 1,000 mL (0 mLs Intravenous Stopped 03/27/16 0214)  ondansetron (ZOFRAN) injection 4 mg (4 mg Intravenous Given 03/27/16 0037)  fentaNYL (SUBLIMAZE) injection 100 mcg (100 mcg Intravenous Given 03/27/16 0037)  metoCLOPramide (REGLAN) injection 10 mg (10 mg Intravenous Given 03/27/16 0131)  diphenhydrAMINE (BENADRYL) injection 12.5 mg (12.5 mg Intravenous Given 03/27/16 0131)  ketorolac (TORADOL) 30 MG/ML injection 30 mg (30 mg Intravenous Given 03/27/16 0132)  sodium chloride 0.9 % bolus 1,000 mL (0 mLs Intravenous Stopped 03/27/16 0132)  iopamidol (ISOVUE-300) 61 % injection 100 mL (100 mLs Intravenous Contrast Given 03/27/16 0244)    Walked to the room with quick and steady gait.   Initial Impression / Assessment and Plan / ED Course  I have reviewed the triage vital signs and the nursing notes.  Pertinent labs & imaging results that were available during my care of the patient were reviewed by me and considered in my medical decision making (see chart for details).   Original family member left and different family member came to room and now patient is crying and stating the pain in both her head and abdomen are suddenly worse despite improvement in vital signs.  Migraine cocktail given.   Clinical Course   Sleeping soundly post medication for migraine.  HR is markedly improved.  Patient sleeping until return from CT now family member is stating she needs more pain medication.  There are no non verbal signs of pain.      Final Clinical Impressions(s) / ED Diagnoses   Final diagnoses:  None    329 am Case d/w Dr. Nyoka Cowden of GI. Patient has cancelled multiple clinic appointments and has not been seen since 01/2015.  He will place note in her file to be called with close follow up within the week.  Short steroid taper.  If able stool to lab.     New Prescriptions New Prescriptions   No medications on file  Will d/c on flagyl for 10 days as well as ultram and short steroid taper.  She will need to follow up within the week with GI at [facility].  All questions answered to patient's satisfaction. Based on history and exam patient has been appropriately medically screened and emergency conditions excluded. Patient is stable for discharge at this time. Follow up with your PMD for recheck in 2 days and strict return precautions given     [facility] Disbro, MD 03/27/16 [id number]

## 2016-04-02 DIAGNOSIS — Z79899 Other long term (current) drug therapy: Secondary | ICD-10-CM | POA: Diagnosis not present

## 2016-04-02 DIAGNOSIS — K51011 Ulcerative (chronic) pancolitis with rectal bleeding: Secondary | ICD-10-CM | POA: Diagnosis not present

## 2016-04-03 DIAGNOSIS — K51011 Ulcerative (chronic) pancolitis with rectal bleeding: Secondary | ICD-10-CM | POA: Diagnosis not present

## 2016-04-30 IMAGING — CT CT ABD-PELV W/ CM
2 of 4 series · 16 of 46 positions shown, 18 images · IV contrast (APPLIED)
Comparison: 03/05/2014

CLINICAL DATA: Abdominal pain, vomiting and history of ulcerative
colitis.

EXAM:
CT ABDOMEN AND PELVIS WITH CONTRAST
TECHNIQUE: Multidetector CT imaging of the abdomen and pelvis was performed
using the standard protocol following bolus administration of
intravenous contrast.
CONTRAST:  100 mL Omnipaque 300 IV

[Series 2: abd/pelvis 5.0 b31f · axial · 0.60mm/px · z∈[-451,-31]mm · 13 of 93 slices shown, 15 images]
[im 5/93  soft-tissue]
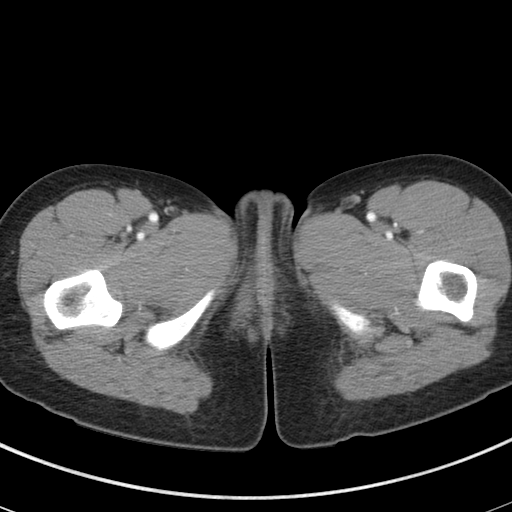
[im 5/93  bone]
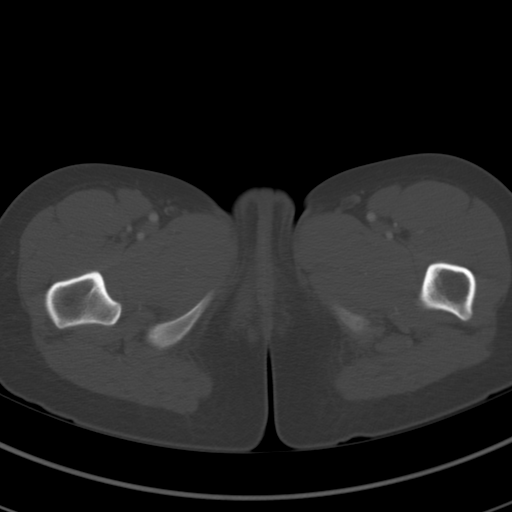
[im 13/93  soft-tissue]
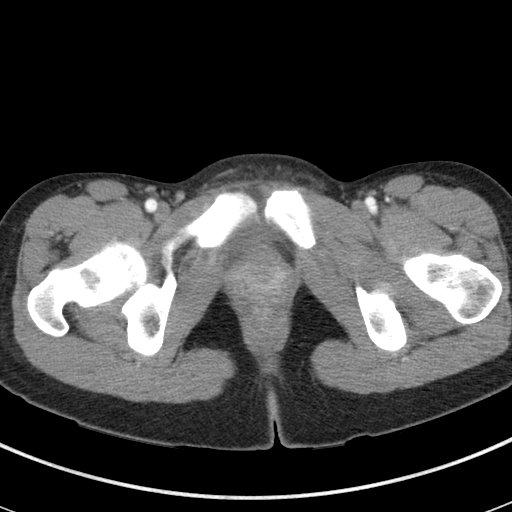
[im 21/93  soft-tissue]
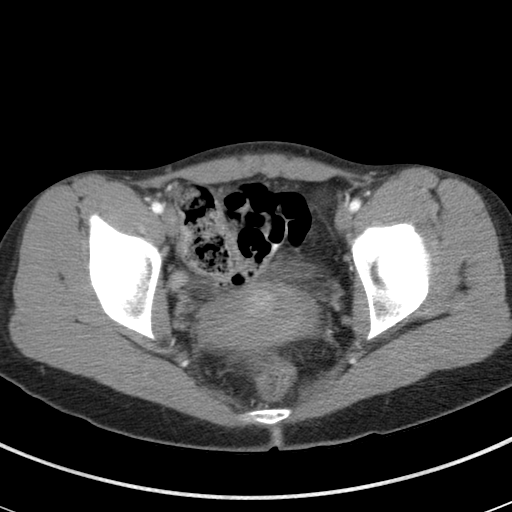
[im 25/93  soft-tissue]
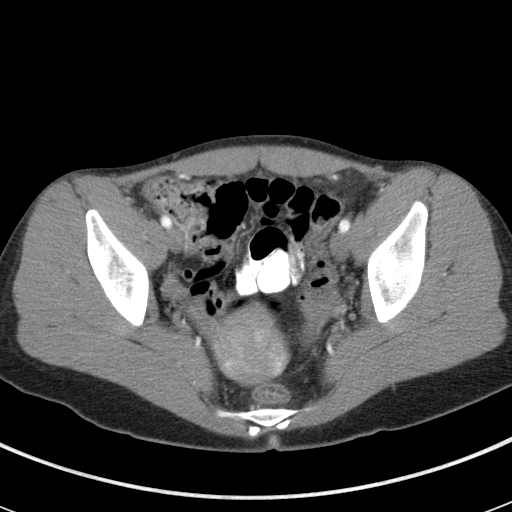
[im 33/93  soft-tissue]
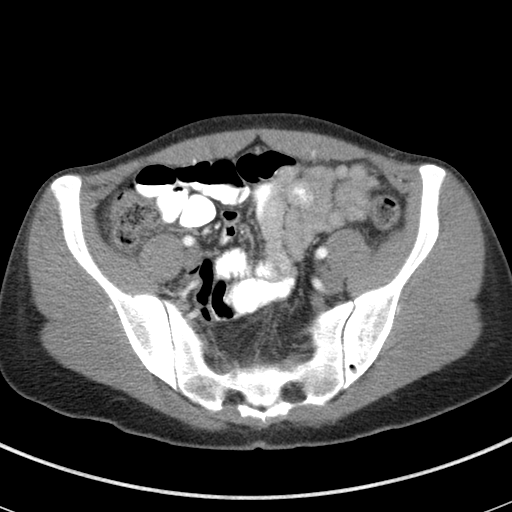
[im 41/93  soft-tissue]
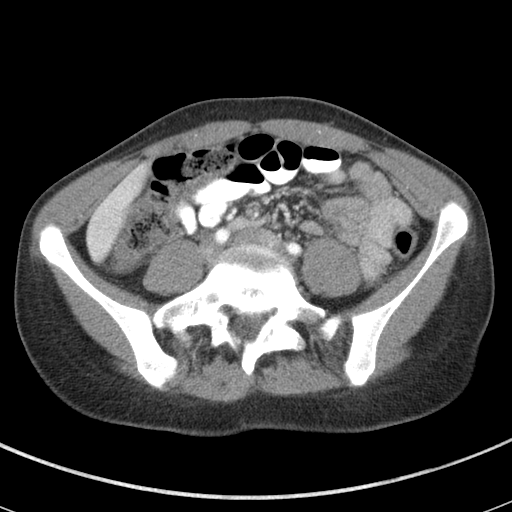
[im 49/93  soft-tissue]
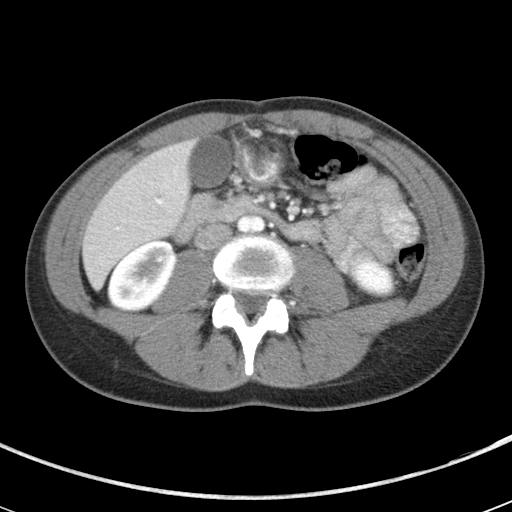
[im 53/93  soft-tissue]
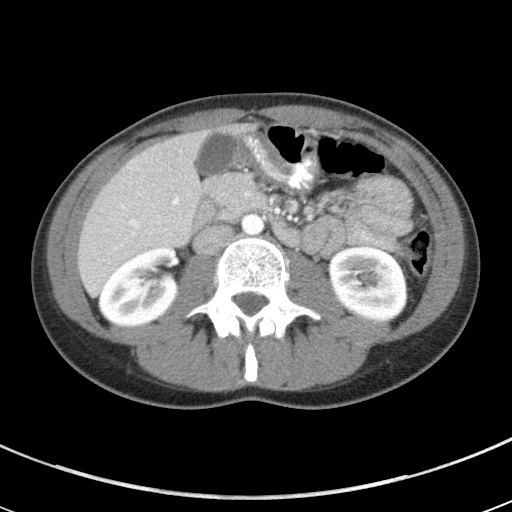
[im 61/93  soft-tissue]
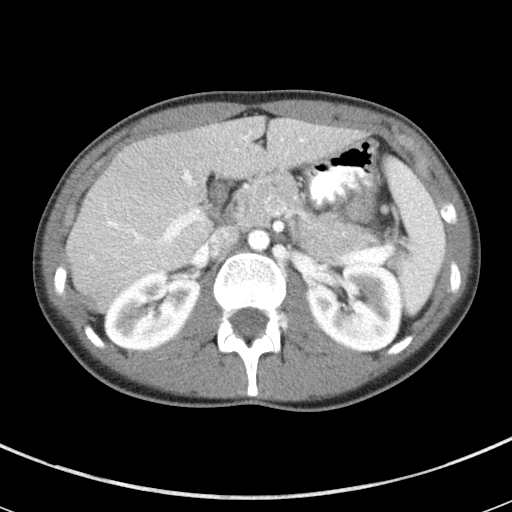
[im 61/93  bone]
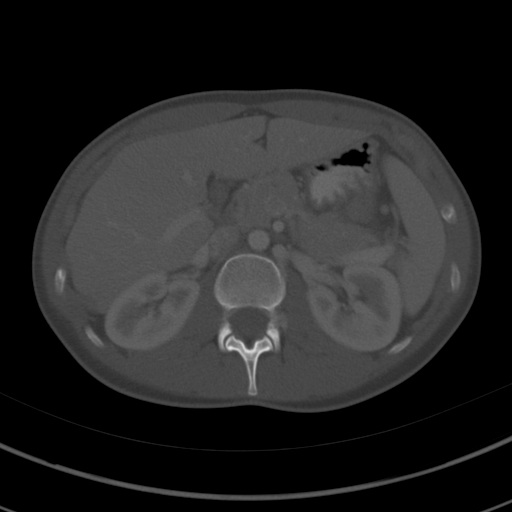
[im 69/93  soft-tissue]
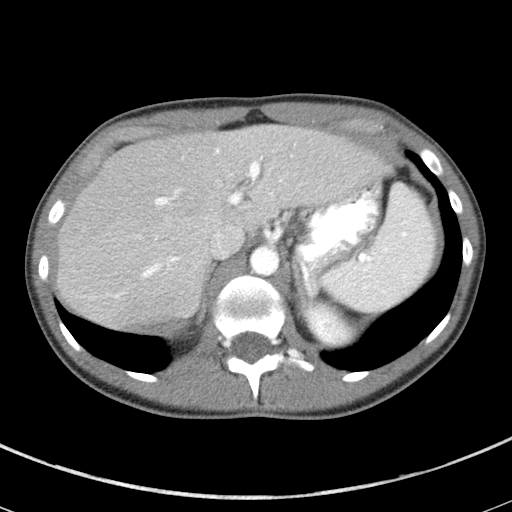
[im 73/93  soft-tissue]
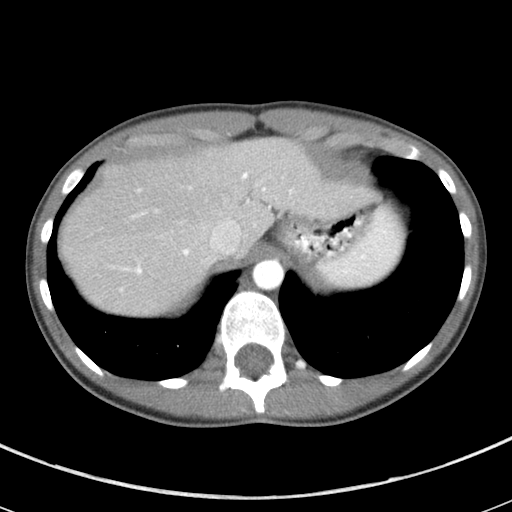
[im 81/93  soft-tissue]
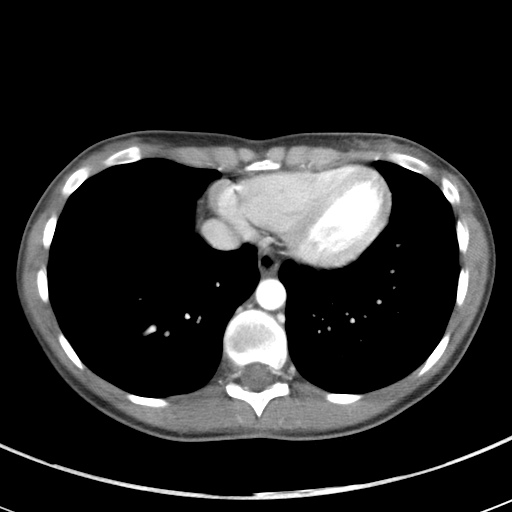
[im 89/93  soft-tissue]
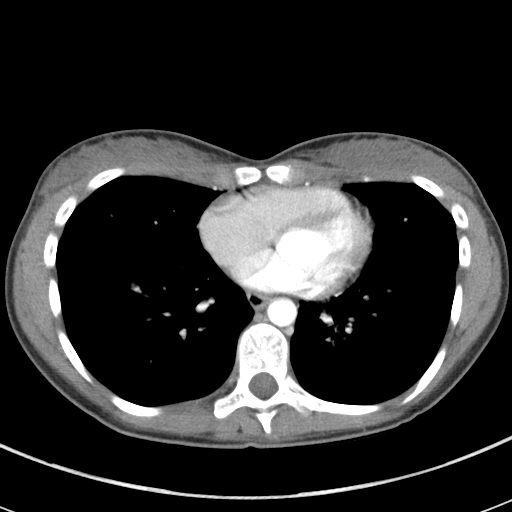

[Series 5: abd/pelvis 3.0 coronal · coronal · 0.65mm/px · 3 of 73 slices shown]
[im 25/73  soft-tissue]
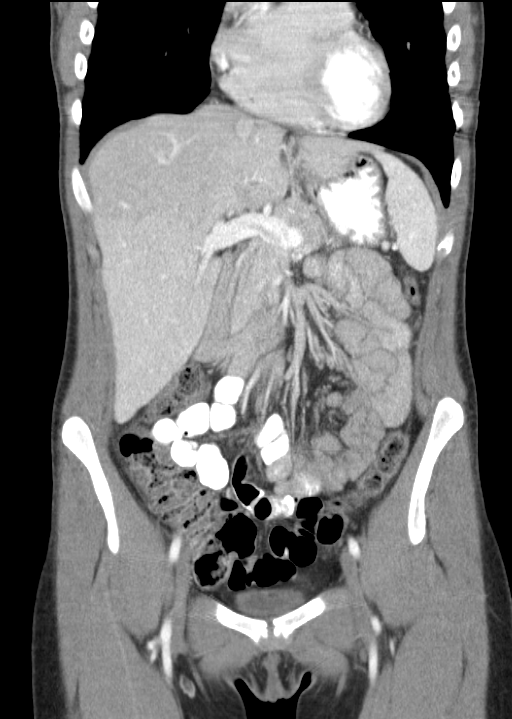
[im 33/73  soft-tissue]
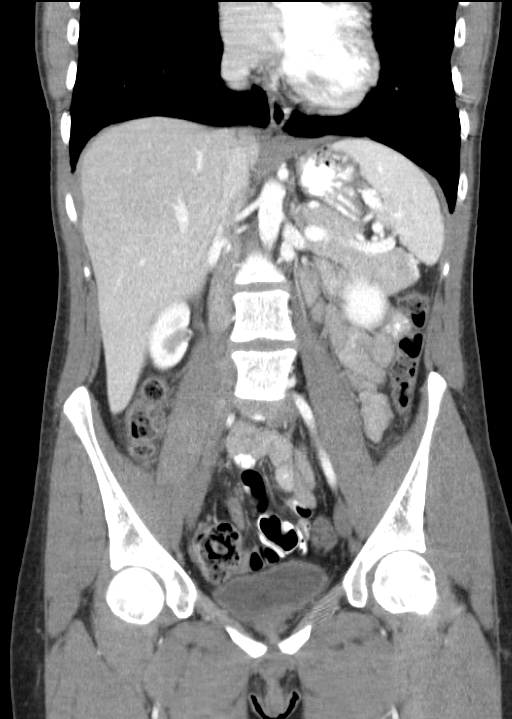
[im 41/73  soft-tissue]
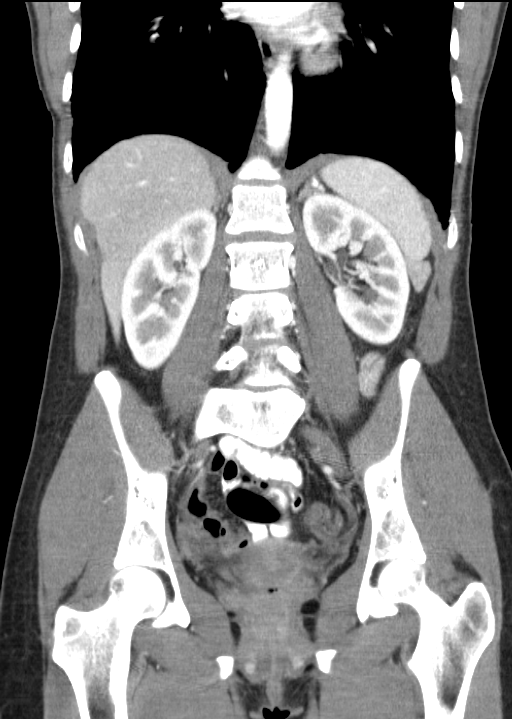

[16 of 46 positions shown; findings below may reference images not displayed]

FINDINGS: The liver, gallbladder, pancreas, spleen, adrenal glands and kidneys
are within normal limits.

Bowel shows no evidence of obstruction or inflammation. No free air
or free fluid is identified. There is no evidence of abscess.

No masses, enlarged lymph nodes or hernias are seen. The bladder,
uterus and adnexal regions are unremarkable by CT. No vascular
abnormalities are seen.

Bony structures show no abnormalities. The S1 level is transitional.
Visualized lung bases are unremarkable.
IMPRESSION: No acute findings in the abdomen or pelvis.

## 2016-05-04 DIAGNOSIS — K5289 Other specified noninfective gastroenteritis and colitis: Secondary | ICD-10-CM | POA: Diagnosis not present

## 2016-05-04 DIAGNOSIS — K51911 Ulcerative colitis, unspecified with rectal bleeding: Secondary | ICD-10-CM | POA: Diagnosis not present

## 2016-05-04 DIAGNOSIS — K5100 Ulcerative (chronic) pancolitis without complications: Secondary | ICD-10-CM | POA: Diagnosis not present

## 2016-05-04 DIAGNOSIS — K5190 Ulcerative colitis, unspecified, without complications: Secondary | ICD-10-CM | POA: Diagnosis not present

## 2016-05-18 DIAGNOSIS — K51011 Ulcerative (chronic) pancolitis with rectal bleeding: Secondary | ICD-10-CM | POA: Diagnosis not present

## 2016-05-18 DIAGNOSIS — R110 Nausea: Secondary | ICD-10-CM | POA: Diagnosis not present

## 2016-05-18 DIAGNOSIS — R42 Dizziness and giddiness: Secondary | ICD-10-CM | POA: Diagnosis not present

## 2016-06-01 DIAGNOSIS — Z23 Encounter for immunization: Secondary | ICD-10-CM | POA: Diagnosis not present

## 2016-06-01 DIAGNOSIS — K51011 Ulcerative (chronic) pancolitis with rectal bleeding: Secondary | ICD-10-CM | POA: Diagnosis not present

## 2016-06-15 DIAGNOSIS — M9901 Segmental and somatic dysfunction of cervical region: Secondary | ICD-10-CM | POA: Diagnosis not present

## 2016-06-15 DIAGNOSIS — M9902 Segmental and somatic dysfunction of thoracic region: Secondary | ICD-10-CM | POA: Diagnosis not present

## 2016-06-15 DIAGNOSIS — M546 Pain in thoracic spine: Secondary | ICD-10-CM | POA: Diagnosis not present

## 2016-06-15 DIAGNOSIS — M542 Cervicalgia: Secondary | ICD-10-CM | POA: Diagnosis not present

## 2016-06-17 DIAGNOSIS — M542 Cervicalgia: Secondary | ICD-10-CM | POA: Diagnosis not present

## 2016-06-17 DIAGNOSIS — M9901 Segmental and somatic dysfunction of cervical region: Secondary | ICD-10-CM | POA: Diagnosis not present

## 2016-06-17 DIAGNOSIS — M546 Pain in thoracic spine: Secondary | ICD-10-CM | POA: Diagnosis not present

## 2016-06-17 DIAGNOSIS — M9902 Segmental and somatic dysfunction of thoracic region: Secondary | ICD-10-CM | POA: Diagnosis not present

## 2016-06-23 DIAGNOSIS — M9902 Segmental and somatic dysfunction of thoracic region: Secondary | ICD-10-CM | POA: Diagnosis not present

## 2016-06-23 DIAGNOSIS — M9901 Segmental and somatic dysfunction of cervical region: Secondary | ICD-10-CM | POA: Diagnosis not present

## 2016-06-23 DIAGNOSIS — M542 Cervicalgia: Secondary | ICD-10-CM | POA: Diagnosis not present

## 2016-06-23 DIAGNOSIS — M546 Pain in thoracic spine: Secondary | ICD-10-CM | POA: Diagnosis not present

## 2016-06-29 DIAGNOSIS — K51011 Ulcerative (chronic) pancolitis with rectal bleeding: Secondary | ICD-10-CM | POA: Diagnosis not present

## 2016-06-30 DIAGNOSIS — M546 Pain in thoracic spine: Secondary | ICD-10-CM | POA: Diagnosis not present

## 2016-06-30 DIAGNOSIS — M9902 Segmental and somatic dysfunction of thoracic region: Secondary | ICD-10-CM | POA: Diagnosis not present

## 2016-06-30 DIAGNOSIS — M542 Cervicalgia: Secondary | ICD-10-CM | POA: Diagnosis not present

## 2016-06-30 DIAGNOSIS — M9901 Segmental and somatic dysfunction of cervical region: Secondary | ICD-10-CM | POA: Diagnosis not present

## 2016-07-01 DIAGNOSIS — M542 Cervicalgia: Secondary | ICD-10-CM | POA: Diagnosis not present

## 2016-07-01 DIAGNOSIS — M9902 Segmental and somatic dysfunction of thoracic region: Secondary | ICD-10-CM | POA: Diagnosis not present

## 2016-07-01 DIAGNOSIS — M9901 Segmental and somatic dysfunction of cervical region: Secondary | ICD-10-CM | POA: Diagnosis not present

## 2016-07-01 DIAGNOSIS — M546 Pain in thoracic spine: Secondary | ICD-10-CM | POA: Diagnosis not present

## 2016-07-02 ENCOUNTER — Emergency Department (HOSPITAL_BASED_OUTPATIENT_CLINIC_OR_DEPARTMENT_OTHER)
Admission: EM | Admit: 2016-07-02 | Discharge: 2016-07-02 | Disposition: A | Discharge status: Home / Self Care | Payer: BLUE CROSS/BLUE SHIELD | Attending: Emergency Medicine | Admitting: Emergency Medicine

## 2016-07-02 ENCOUNTER — Encounter (HOSPITAL_BASED_OUTPATIENT_CLINIC_OR_DEPARTMENT_OTHER): Payer: Self-pay | Admitting: Emergency Medicine

## 2016-07-02 DIAGNOSIS — R112 Nausea with vomiting, unspecified: Secondary | ICD-10-CM | POA: Insufficient documentation

## 2016-07-02 DIAGNOSIS — Z79899 Other long term (current) drug therapy: Secondary | ICD-10-CM | POA: Insufficient documentation

## 2016-07-02 DIAGNOSIS — Z87891 Personal history of nicotine dependence: Secondary | ICD-10-CM | POA: Insufficient documentation

## 2016-07-02 LAB — CBC WITH DIFFERENTIAL/PLATELET
Basophils Absolute: 0 10*3/uL (ref 0.0–0.1)
Basophils Relative: 0 %
EOS PCT: 1 %
Eosinophils Absolute: 0.1 10*3/uL (ref 0.0–0.7)
HCT: 42 % (ref 36.0–46.0)
Hemoglobin: 14.2 g/dL (ref 12.0–15.0)
LYMPHS ABS: 2.4 10*3/uL (ref 0.7–4.0)
LYMPHS PCT: 19 %
MCH: 31.3 pg (ref 26.0–34.0)
MCHC: 33.8 g/dL (ref 30.0–36.0)
MCV: 92.5 fL (ref 78.0–100.0)
MONO ABS: 0.6 10*3/uL (ref 0.1–1.0)
Monocytes Relative: 5 %
Neutro Abs: 9.7 10*3/uL — ABNORMAL HIGH (ref 1.7–7.7)
Neutrophils Relative %: 75 %
PLATELETS: 259 10*3/uL (ref 150–400)
RBC: 4.54 MIL/uL (ref 3.87–5.11)
RDW: 13.8 % (ref 11.5–15.5)
WBC: 12.8 10*3/uL — ABNORMAL HIGH (ref 4.0–10.5)

## 2016-07-02 LAB — BASIC METABOLIC PANEL
Anion gap: 18 — ABNORMAL HIGH (ref 5–15)
BUN: 16 mg/dL (ref 6–20)
CHLORIDE: 104 mmol/L (ref 101–111)
CO2: 21 mmol/L — AB (ref 22–32)
Calcium: 9.4 mg/dL (ref 8.9–10.3)
Creatinine, Ser: 0.77 mg/dL (ref 0.44–1.00)
GFR calc Af Amer: >60 mL/min (ref >60)
GFR calc non Af Amer: >60 mL/min (ref >60)
GLUCOSE: 75 mg/dL (ref 65–99)
POTASSIUM: 3.9 mmol/L (ref 3.5–5.1)
Sodium: 143 mmol/L (ref 135–145)

## 2016-07-02 MED ORDER — ONDANSETRON HCL 4 MG/2ML IJ SOLN
INTRAMUSCULAR | Status: AC
  Administered 2016-07-02: 4 mg via INTRAVENOUS
  Filled 2016-07-02: qty 2

## 2016-07-02 MED ORDER — LORAZEPAM 2 MG/ML IJ SOLN
INTRAMUSCULAR | Status: AC
  Administered 2016-07-02: 1 mg via INTRAVENOUS
  Filled 2016-07-02: qty 1

## 2016-07-02 MED ORDER — ONDANSETRON 8 MG PO TBDP: 8.0000 mg | ORAL_TABLET | Freq: Three times a day (TID) | ORAL | 0 refills | Status: DC | PRN

## 2016-07-02 MED ORDER — ONDANSETRON HCL 4 MG/2ML IJ SOLN
4.0000 mg | Freq: Once | INTRAMUSCULAR | Status: AC
  Administered 2016-07-02: 4 mg via INTRAVENOUS

## 2016-07-02 MED ORDER — FENTANYL CITRATE (PF) 100 MCG/2ML IJ SOLN
50.0000 ug | Freq: Once | INTRAMUSCULAR | Status: AC
  Administered 2016-07-02: 50 ug via INTRAVENOUS
  Filled 2016-07-02: qty 2

## 2016-07-02 MED ORDER — SODIUM CHLORIDE 0.9 % IV BOLUS (SEPSIS)
1000.0000 mL | Freq: Once | INTRAVENOUS | Status: AC
  Administered 2016-07-02: 1000 mL via INTRAVENOUS

## 2016-07-02 MED ORDER — LORAZEPAM 2 MG/ML IJ SOLN
1.0000 mg | Freq: Once | INTRAMUSCULAR | Status: AC
  Administered 2016-07-02: 1 mg via INTRAVENOUS

## 2016-07-02 NOTE — ED Notes (Signed)
ED Provider at bedside. 

## 2016-07-02 NOTE — ED Triage Notes (Signed)
Pt reports vomiting x 5 hours. Denies diarrhea

## 2016-07-02 NOTE — ED Triage Notes (Signed)
Pt admits to drinking alcohol earlier tonight but denies excessive intake.

## 2016-07-02 NOTE — ED Notes (Addendum)
Pt had Entyvio infusion x 3 days ago.

## 2016-07-02 NOTE — ED Provider Notes (Signed)
[facility] DEPT [facility] Provider Note: Georgena Spurling, MD, FACEP  CSN: [id number] MRN: [id number] ARRIVAL: 07/02/16 at [place]: [place] is a 20 y.o. female with a history of ulcerative colitis. She had her last Entyvio infusion 3 days ago. She was out drinking with a friend yesterday evening into this morning. She states she had about 5 shots and a beer. She thinks she may have overdid it. She began vomiting 1 AM and has vomited multiple times since. She took 4 milligrams of Zofran about midnight and again at 3 AM without relief. She is also having moderate cramping diffuse abdominal pain. She has not had diarrhea this morning and has not had a bowel movement since yesterday. Her last menstrual period began about the first of this month.    Past Medical History:  Diagnosis Date  . Anxiety   . Bipolar disorder ([place])   . Colitis, ulcerative ([place]) 2012   ulceration of colon  . Migraine     Past Surgical History:  Procedure Laterality Date  . WISDOM TOOTH EXTRACTION  2007    Family History  Problem Relation Age of Onset  . Alcohol abuse Paternal Grandfather     Social History  Substance Use Topics  . Smoking status: Former Smoker    Packs/day: 0.10    Years: 2.00  . Smokeless tobacco: Never Used  . Alcohol use Yes    Prior to Admission medications   Medication Sig Start Date End Date Taking? Authorizing Provider  azaTHIOprine (IMURAN) 50 MG tablet Take 50 mg by mouth at bedtime.     Historical Provider, MD  clonazePAM (KLONOPIN) 0.5 MG tablet Take 1 mg by mouth 3 (three) times daily as needed. For anxiety    Historical Provider, MD  divalproex (DEPAKOTE) 500 MG DR tablet Take 500 mg by mouth daily.    Historical Provider, MD  fexofenadine (ALLEGRA) 180 MG tablet Take 180 mg by mouth daily.    Historical Provider, MD  folic acid (FOLVITE) 1 MG tablet Take 1 mg by mouth daily.    Historical  Provider, MD  lamoTRIgine (LAMICTAL) 200 MG tablet Take 200 mg by mouth daily.    Historical Provider, MD  levonorgestrel-ethinyl estradiol (ENPRESSE,TRIVORA) tablet Take 1 tablet by mouth daily.    Historical Provider, MD  levonorgestrel-ethinyl estradiol (NORDETTE) 0.15-30 MG-MCG tablet Take 1 tablet by mouth daily.    Historical Provider, MD  lurasidone (LATUDA) 80 MG TABS Take by mouth daily with breakfast.    Historical Provider, MD  metroNIDAZOLE (FLAGYL) 500 MG tablet Take 1 tablet (500 mg total) by mouth 3 (three) times daily. 03/27/16   April Palumbo, MD  predniSONE (DELTASONE) 20 MG tablet 3 tabs po day one, then 2 po daily x 4 days 03/27/16   April Palumbo, MD  QUEtiapine (SEROQUEL) 400 MG tablet Take 400 mg by mouth at bedtime.    Historical Provider, MD  sulfaSALAzine (AZULFIDINE) 500 MG tablet Take 500 mg by mouth 4 (four) times daily.    Historical Provider, MD  traMADol (ULTRAM) 50 MG tablet Take 1 tablet (50 mg total) by mouth every 6 (six) hours as needed. 03/27/16   April Palumbo, MD    Allergies Dilaudid [hydromorphone hcl]   REVIEW OF SYSTEMS  Negative except as noted here or in the History of Present Illness.   PHYSICAL EXAMINATION  Initial Vital Signs Blood pressure 155/87, pulse 75, resp. rate 22,  height 5' 5"  (1.651 m), weight 115 lb (52.2 kg), SpO2 100 %.  Examination General: Well-developed, thin female in no acute distress; appearance consistent with age of record HENT: normocephalic; atraumatic Eyes: pupils equal, round and reactive to light; extraocular muscles intact Neck: supple Heart: regular rate and rhythm Lungs: clear to auscultation bilaterally Abdomen: soft; nondistended; diffusely tender; no masses or hepatosplenomegaly; bowel sounds present Extremities: No deformity; full range of motion; pulses normal Neurologic: Awake, alert and oriented; motor function intact in all extremities and symmetric; no facial droop Skin: Warm and dry Psychiatric:  Normal mood and affect   RESULTS  Summary of this visit's results, reviewed by myself:   EKG Interpretation  Date/Time:    Ventricular Rate:    PR Interval:    QRS Duration:   QT Interval:    QTC Calculation:   R Axis:     Text Interpretation:        Laboratory Studies: Results for orders placed or performed during the hospital encounter of 07/02/16 (from the past 24 hour(s))  Basic metabolic panel     Status: Abnormal   Collection Time: 07/02/16  5:52 AM  Result Value Ref Range   Sodium 143 135 - 145 mmol/L   Potassium 3.9 3.5 - 5.1 mmol/L   Chloride 104 101 - 111 mmol/L   CO2 21 (L) 22 - 32 mmol/L   Glucose, Bld 75 65 - 99 mg/dL   BUN 16 6 - 20 mg/dL   Creatinine, Ser 0.77 0.44 - 1.00 mg/dL   Calcium 9.4 8.9 - 10.3 mg/dL   GFR calc non Af Amer >60 >60 mL/min   GFR calc Af Amer >60 >60 mL/min   Anion gap 18 (H) 5 - 15  CBC with Differential/Platelet     Status: Abnormal   Collection Time: 07/02/16  5:52 AM  Result Value Ref Range   WBC 12.8 (H) 4.0 - 10.5 K/uL   RBC 4.54 3.87 - 5.11 MIL/uL   Hemoglobin 14.2 12.0 - 15.0 g/dL   HCT 42.0 36.0 - 46.0 %   MCV 92.5 78.0 - 100.0 fL   MCH 31.3 26.0 - 34.0 pg   MCHC 33.8 30.0 - 36.0 g/dL   RDW 13.8 11.5 - 15.5 %   Platelets 259 150 - 400 K/uL   Neutrophils Relative % 75 %   Neutro Abs 9.7 (H) 1.7 - 7.7 K/uL   Lymphocytes Relative 19 %   Lymphs Abs 2.4 0.7 - 4.0 K/uL   Monocytes Relative 5 %   Monocytes Absolute 0.6 0.1 - 1.0 K/uL   Eosinophils Relative 1 %   Eosinophils Absolute 0.1 0.0 - 0.7 K/uL   Basophils Relative 0 %   Basophils Absolute 0.0 0.0 - 0.1 K/uL   Imaging Studies: No results found.  ED COURSE  Nursing notes and initial vitals signs, including pulse oximetry, reviewed.  Vitals:   07/02/16 0549 07/02/16 0551  BP: 155/87   Pulse: 75   Resp: 22   SpO2: 100%   Weight:  115 lb (52.2 kg)  Height:  5' 5"  (1.651 m)   7:09 AM Nausea was controlled and patient was drinking fluids without emesis.  She was given 50 micrograms of IV for her abdominal pain and this caused her to vomit again. She is presently eating ice chips with a second liter of normal saline infusing.  PROCEDURES    ED DIAGNOSES     ICD-9-CM ICD-10-CM   1. Nausea and vomiting in adult 787.01 R11.2  Shanon Rosser, MD 07/02/16 [phone]

## 2016-07-02 NOTE — ED Notes (Signed)
Pt verbalized understanding of discharge instructions and denies any further questions at this time.   

## 2016-08-06 DIAGNOSIS — F3175 Bipolar disorder, in partial remission, most recent episode depressed: Secondary | ICD-10-CM | POA: Diagnosis not present

## 2016-09-01 DIAGNOSIS — K51011 Ulcerative (chronic) pancolitis with rectal bleeding: Secondary | ICD-10-CM | POA: Diagnosis not present

## 2016-09-01 DIAGNOSIS — K5100 Ulcerative (chronic) pancolitis without complications: Secondary | ICD-10-CM | POA: Diagnosis not present

## 2016-09-23 DIAGNOSIS — S199XXA Unspecified injury of neck, initial encounter: Secondary | ICD-10-CM | POA: Diagnosis not present

## 2016-09-23 DIAGNOSIS — S4992XA Unspecified injury of left shoulder and upper arm, initial encounter: Secondary | ICD-10-CM | POA: Diagnosis not present

## 2016-09-23 DIAGNOSIS — F17200 Nicotine dependence, unspecified, uncomplicated: Secondary | ICD-10-CM | POA: Diagnosis not present

## 2016-09-23 DIAGNOSIS — S299XXA Unspecified injury of thorax, initial encounter: Secondary | ICD-10-CM | POA: Diagnosis not present

## 2016-09-23 DIAGNOSIS — M25512 Pain in left shoulder: Secondary | ICD-10-CM | POA: Diagnosis not present

## 2016-09-23 DIAGNOSIS — M542 Cervicalgia: Secondary | ICD-10-CM | POA: Diagnosis not present

## 2016-09-23 DIAGNOSIS — M546 Pain in thoracic spine: Secondary | ICD-10-CM | POA: Diagnosis not present

## 2016-09-23 DIAGNOSIS — S161XXA Strain of muscle, fascia and tendon at neck level, initial encounter: Secondary | ICD-10-CM | POA: Diagnosis not present

## 2016-09-23 DIAGNOSIS — R0781 Pleurodynia: Secondary | ICD-10-CM | POA: Diagnosis not present

## 2016-09-23 DIAGNOSIS — S39012A Strain of muscle, fascia and tendon of lower back, initial encounter: Secondary | ICD-10-CM | POA: Diagnosis not present

## 2016-11-02 DIAGNOSIS — E860 Dehydration: Secondary | ICD-10-CM | POA: Diagnosis not present

## 2016-11-02 DIAGNOSIS — K51011 Ulcerative (chronic) pancolitis with rectal bleeding: Secondary | ICD-10-CM | POA: Diagnosis not present

## 2016-11-05 DIAGNOSIS — F3175 Bipolar disorder, in partial remission, most recent episode depressed: Secondary | ICD-10-CM | POA: Diagnosis not present

## 2017-01-20 DIAGNOSIS — N938 Other specified abnormal uterine and vaginal bleeding: Secondary | ICD-10-CM | POA: Diagnosis not present

## 2017-01-29 DIAGNOSIS — K51011 Ulcerative (chronic) pancolitis with rectal bleeding: Secondary | ICD-10-CM | POA: Diagnosis not present

## 2017-01-29 DIAGNOSIS — E860 Dehydration: Secondary | ICD-10-CM | POA: Diagnosis not present

## 2017-02-24 DIAGNOSIS — F3175 Bipolar disorder, in partial remission, most recent episode depressed: Secondary | ICD-10-CM | POA: Diagnosis not present

## 2017-03-23 DIAGNOSIS — R404 Transient alteration of awareness: Secondary | ICD-10-CM | POA: Diagnosis not present

## 2017-03-23 DIAGNOSIS — R55 Syncope and collapse: Secondary | ICD-10-CM | POA: Diagnosis not present

## 2017-03-23 DIAGNOSIS — R42 Dizziness and giddiness: Secondary | ICD-10-CM | POA: Diagnosis not present

## 2017-03-29 DIAGNOSIS — K51011 Ulcerative (chronic) pancolitis with rectal bleeding: Secondary | ICD-10-CM | POA: Diagnosis not present

## 2017-05-03 DIAGNOSIS — R55 Syncope and collapse: Secondary | ICD-10-CM | POA: Diagnosis not present

## 2017-05-03 DIAGNOSIS — R002 Palpitations: Secondary | ICD-10-CM | POA: Diagnosis not present

## 2017-05-26 DIAGNOSIS — M79662 Pain in left lower leg: Secondary | ICD-10-CM | POA: Diagnosis not present

## 2017-05-26 DIAGNOSIS — S335XXA Sprain of ligaments of lumbar spine, initial encounter: Secondary | ICD-10-CM | POA: Diagnosis not present

## 2017-05-26 DIAGNOSIS — S233XXA Sprain of ligaments of thoracic spine, initial encounter: Secondary | ICD-10-CM | POA: Diagnosis not present

## 2017-05-26 DIAGNOSIS — S134XXA Sprain of ligaments of cervical spine, initial encounter: Secondary | ICD-10-CM | POA: Diagnosis not present

## 2017-05-27 DIAGNOSIS — S134XXA Sprain of ligaments of cervical spine, initial encounter: Secondary | ICD-10-CM | POA: Diagnosis not present

## 2017-05-27 DIAGNOSIS — M79662 Pain in left lower leg: Secondary | ICD-10-CM | POA: Diagnosis not present

## 2017-05-27 DIAGNOSIS — S233XXA Sprain of ligaments of thoracic spine, initial encounter: Secondary | ICD-10-CM | POA: Diagnosis not present

## 2017-05-27 DIAGNOSIS — S335XXA Sprain of ligaments of lumbar spine, initial encounter: Secondary | ICD-10-CM | POA: Diagnosis not present

## 2017-05-28 DIAGNOSIS — S134XXA Sprain of ligaments of cervical spine, initial encounter: Secondary | ICD-10-CM | POA: Diagnosis not present

## 2017-05-28 DIAGNOSIS — S335XXA Sprain of ligaments of lumbar spine, initial encounter: Secondary | ICD-10-CM | POA: Diagnosis not present

## 2017-05-28 DIAGNOSIS — S233XXA Sprain of ligaments of thoracic spine, initial encounter: Secondary | ICD-10-CM | POA: Diagnosis not present

## 2017-05-28 DIAGNOSIS — M79662 Pain in left lower leg: Secondary | ICD-10-CM | POA: Diagnosis not present

## 2017-05-31 DIAGNOSIS — S233XXA Sprain of ligaments of thoracic spine, initial encounter: Secondary | ICD-10-CM | POA: Diagnosis not present

## 2017-05-31 DIAGNOSIS — S335XXA Sprain of ligaments of lumbar spine, initial encounter: Secondary | ICD-10-CM | POA: Diagnosis not present

## 2017-05-31 DIAGNOSIS — M79662 Pain in left lower leg: Secondary | ICD-10-CM | POA: Diagnosis not present

## 2017-05-31 DIAGNOSIS — S134XXA Sprain of ligaments of cervical spine, initial encounter: Secondary | ICD-10-CM | POA: Diagnosis not present

## 2017-06-01 DIAGNOSIS — S233XXA Sprain of ligaments of thoracic spine, initial encounter: Secondary | ICD-10-CM | POA: Diagnosis not present

## 2017-06-01 DIAGNOSIS — S335XXA Sprain of ligaments of lumbar spine, initial encounter: Secondary | ICD-10-CM | POA: Diagnosis not present

## 2017-06-01 DIAGNOSIS — S134XXA Sprain of ligaments of cervical spine, initial encounter: Secondary | ICD-10-CM | POA: Diagnosis not present

## 2017-06-01 DIAGNOSIS — M79662 Pain in left lower leg: Secondary | ICD-10-CM | POA: Diagnosis not present

## 2017-06-10 DIAGNOSIS — E860 Dehydration: Secondary | ICD-10-CM | POA: Diagnosis not present

## 2017-06-10 DIAGNOSIS — K51011 Ulcerative (chronic) pancolitis with rectal bleeding: Secondary | ICD-10-CM | POA: Diagnosis not present

## 2017-06-14 DIAGNOSIS — F3175 Bipolar disorder, in partial remission, most recent episode depressed: Secondary | ICD-10-CM | POA: Diagnosis not present

## 2017-07-28 ENCOUNTER — Other Ambulatory Visit: Payer: Self-pay

## 2017-07-28 ENCOUNTER — Emergency Department (HOSPITAL_COMMUNITY)
Admission: EM | Admit: 2017-07-28 | Discharge: 2017-07-29 | Disposition: A | Discharge status: Other Acute Inpatient Hospital | Payer: BLUE CROSS/BLUE SHIELD | Attending: Emergency Medicine | Admitting: Emergency Medicine

## 2017-07-28 ENCOUNTER — Ambulatory Visit (HOSPITAL_COMMUNITY)
Admission: RE | Admit: 2017-07-28 | Discharge: 2017-07-28 | Disposition: A | Discharge status: Home / Self Care | Payer: BLUE CROSS/BLUE SHIELD | Source: Home / Self Care | Attending: Psychiatry | Admitting: Psychiatry

## 2017-07-28 ENCOUNTER — Encounter (HOSPITAL_COMMUNITY): Payer: Self-pay | Admitting: Emergency Medicine

## 2017-07-28 DIAGNOSIS — F329 Major depressive disorder, single episode, unspecified: Secondary | ICD-10-CM | POA: Diagnosis present

## 2017-07-28 DIAGNOSIS — F17210 Nicotine dependence, cigarettes, uncomplicated: Secondary | ICD-10-CM | POA: Diagnosis not present

## 2017-07-28 DIAGNOSIS — F319 Bipolar disorder, unspecified: Secondary | ICD-10-CM | POA: Insufficient documentation

## 2017-07-28 DIAGNOSIS — Z79899 Other long term (current) drug therapy: Secondary | ICD-10-CM | POA: Insufficient documentation

## 2017-07-28 DIAGNOSIS — G43909 Migraine, unspecified, not intractable, without status migrainosus: Secondary | ICD-10-CM | POA: Diagnosis not present

## 2017-07-28 DIAGNOSIS — Z1330 Encounter for screening examination for mental health and behavioral disorders, unspecified: Secondary | ICD-10-CM

## 2017-07-28 DIAGNOSIS — R45851 Suicidal ideations: Secondary | ICD-10-CM

## 2017-07-28 DIAGNOSIS — F332 Major depressive disorder, recurrent severe without psychotic features: Secondary | ICD-10-CM | POA: Insufficient documentation

## 2017-07-28 DIAGNOSIS — R519 Headache, unspecified: Secondary | ICD-10-CM

## 2017-07-28 DIAGNOSIS — R51 Headache: Secondary | ICD-10-CM

## 2017-07-28 DIAGNOSIS — F419 Anxiety disorder, unspecified: Secondary | ICD-10-CM

## 2017-07-28 LAB — COMPREHENSIVE METABOLIC PANEL
ALT: 13 U/L — AB (ref 14–54)
AST: 19 U/L (ref 15–41)
Albumin: 4.2 g/dL (ref 3.5–5.0)
Alkaline Phosphatase: 36 U/L — ABNORMAL LOW (ref 38–126)
Anion gap: 8 (ref 5–15)
BILIRUBIN TOTAL: 0.3 mg/dL (ref 0.3–1.2)
BUN: 9 mg/dL (ref 6–20)
CHLORIDE: 107 mmol/L (ref 101–111)
CO2: 23 mmol/L (ref 22–32)
CREATININE: 0.73 mg/dL (ref 0.44–1.00)
Calcium: 8.9 mg/dL (ref 8.9–10.3)
GFR calc Af Amer: >60 mL/min (ref >60)
Glucose, Bld: 87 mg/dL (ref 65–99)
Potassium: 3.9 mmol/L (ref 3.5–5.1)
Sodium: 138 mmol/L (ref 135–145)
TOTAL PROTEIN: 7.6 g/dL (ref 6.5–8.1)

## 2017-07-28 LAB — RAPID URINE DRUG SCREEN, HOSP PERFORMED
Amphetamines: NOT DETECTED
Barbiturates: NOT DETECTED
Benzodiazepines: POSITIVE — AB
Cocaine: POSITIVE — AB
OPIATES: NOT DETECTED
Tetrahydrocannabinol: POSITIVE — AB

## 2017-07-28 LAB — I-STAT BETA HCG BLOOD, ED (MC, WL, AP ONLY)

## 2017-07-28 LAB — ETHANOL

## 2017-07-28 LAB — CBC
HCT: 44.1 % (ref 36.0–46.0)
Hemoglobin: 15 g/dL (ref 12.0–15.0)
MCH: 31.8 pg (ref 26.0–34.0)
MCHC: 34 g/dL (ref 30.0–36.0)
MCV: 93.6 fL (ref 78.0–100.0)
Platelets: 272 10*3/uL (ref 150–400)
RBC: 4.71 MIL/uL (ref 3.87–5.11)
RDW: 13.5 % (ref 11.5–15.5)
WBC: 8.3 10*3/uL (ref 4.0–10.5)

## 2017-07-28 LAB — ACETAMINOPHEN LEVEL: Acetaminophen (Tylenol), Serum: <10 ug/mL — ABNORMAL LOW (ref 10–30)

## 2017-07-28 LAB — SALICYLATE LEVEL

## 2017-07-28 MED ORDER — DIPHENHYDRAMINE HCL 50 MG/ML IJ SOLN
25.0000 mg | Freq: Once | INTRAMUSCULAR | Status: AC
  Administered 2017-07-28: 25 mg via INTRAVENOUS
  Filled 2017-07-28: qty 1

## 2017-07-28 MED ORDER — PROCHLORPERAZINE EDISYLATE 5 MG/ML IJ SOLN
10.0000 mg | Freq: Once | INTRAMUSCULAR | Status: AC
  Administered 2017-07-28: 10 mg via INTRAVENOUS
  Filled 2017-07-28 (×2): qty 2

## 2017-07-28 MED ORDER — MAGNESIUM SULFATE 2 GM/50ML IV SOLN
2.0000 g | Freq: Once | INTRAVENOUS | Status: AC
  Administered 2017-07-28: 2 g via INTRAVENOUS
  Filled 2017-07-28 (×2): qty 50

## 2017-07-28 MED ORDER — SODIUM CHLORIDE 0.9 % IV BOLUS (SEPSIS)
1000.0000 mL | Freq: Once | INTRAVENOUS | Status: AC
  Administered 2017-07-28: 1000 mL via INTRAVENOUS

## 2017-07-28 NOTE — ED Notes (Signed)
Pt sitting up with family at present, Vomiting noted, A&O x 3 no acute distress.  Monitoring for safety, Q 15 min checks in effect.  Sitter at bedside.  Pending IV and labs.

## 2017-07-28 NOTE — ED Triage Notes (Signed)
Pt tearfully reports that she feels suicidal with a plan to overdose on Seroquel.

## 2017-07-28 NOTE — [facility] Counselor (Signed)
Patient transported from [facility] to [facility] for medical clearance.

## 2017-07-28 NOTE — BH Assessment (Signed)
Assessment Note  Kimberly Caldwell is an 22 y.o. female presented to [facility] as a walk-in accompanied by her grandmother and boyfriend with complaints of suicidal ideation with a plan to overdose on Seroquel. Patient has a history of cocaine abuse. Report last use Friday 07/23/2017 ($30 worth). Report feeling anxious, nauseous, increased anxiety, stomach cramps restless, hot/cold sweats and night terrors. Denies homicidal ideation, auditory/visual hallucinations and paranoia.   Dr. Adelene Idler manages patient medication, last appointment June 29, 2018. Denies history of trauma physical, verbal or sexual abuse.   Diagnosis: F33.2   Major depressive disorder, Recurrent episode, Severe  Past Medical History:  Past Medical History:  Diagnosis Date  . Anxiety   . Bipolar disorder ([place])   . Colitis, ulcerative ([place]) 2012   ulceration of colon  . Migraine     Past Surgical History:  Procedure Laterality Date  . WISDOM TOOTH EXTRACTION  2007    Family History:  Family History  Problem Relation Age of Onset  . Alcohol abuse Paternal Grandfather     Social History:  reports that she has quit smoking. She has a 0.20 pack-year smoking history. she has never used smokeless tobacco. She reports that she drinks alcohol. She reports that she does not use drugs.  Additional Social History:  Alcohol / Drug Use Pain Medications: see MAR Prescriptions: see MAR Over the Counter: see MAR History of alcohol / drug use?: Yes Substance #1 Name of Substance 1: cocaine 1 - Age of First Use: 18 1 - Amount (size/oz): unknown 1 - Frequency: every weekend 1 - Duration: ongoing 1 - Last Use / Amount: Friday 07/13/17  CIWA: CIWA-Ar BP: 105/69 Pulse Rate: (!) 112 COWS:    Allergies:  Allergies  Allergen Reactions  . Dilaudid [Hydromorphone Hcl]     Violent nausea     Home Medications:  (Not in a hospital admission)  OB/GYN Status:  No LMP recorded.  General Assessment Data Location of Assessment: [facility]  Assessment Services TTS Assessment: In system Is this a Tele or Face-to-Face Assessment?: Face-to-Face Is this an Initial Assessment or a Re-assessment for this encounter?: Initial Assessment Marital status: Single Maiden name: n/a Is patient pregnant?: No Pregnancy Status: No Living Arrangements: Spouse/significant other(lives with boyfriend) Can pt return to current living arrangement?: Yes Admission Status: Voluntary Is patient capable of signing voluntary admission?: Yes Referral Source: Self/Family/Friend Insurance type: BCBS     Crisis Care Plan Living Arrangements: Spouse/significant other(lives with boyfriend) Name of Psychiatrist: Dr. Adelene Idler  Name of Therapist: Dr. Ilsa Iha has not seen in months)  Education Status Is patient currently in school?: No Highest grade of school patient has completed: associates degree  Risk to self with the past 6 months Suicidal Ideation: Yes-Currently Present Has patient been a risk to self within the past 6 months prior to admission? : No Suicidal Intent: No Has patient had any suicidal intent within the past 6 months prior to admission? : No Is patient at risk for suicide?: Yes Suicidal Plan?: Yes-Currently Present Has patient had any suicidal plan within the past 6 months prior to admission? : Yes Specify Current Suicidal Plan: overdose Access to Means: Yes Specify Access to Suicidal Means: pt report she has pills at home, old meds  What has been your use of drugs/alcohol within the last 12 months?: cocaine(report last use 07/23/2017) Previous Attempts/Gestures: Yes How many times?: 1(report age 58 y/o SI attempt via overdose) Other Self Harm Risks: none report Triggers for Past Attempts: Unknown Intentional Self Injurious Behavior:  None Family Suicide History: No Recent stressful life event(s): (unknown) Persecutory voices/beliefs?: No Depression: Yes Depression Symptoms: Insomnia, Feeling worthless/self pity, Loss of  interest in usual pleasures, Tearfulness Substance abuse history and/or treatment for substance abuse?: No Suicide prevention information given to non-admitted patients: Not applicable  Risk to Others within the past 6 months Homicidal Ideation: No Does patient have any lifetime risk of violence toward others beyond the six months prior to admission? : No Thoughts of Harm to Others: No Current Homicidal Intent: No Current Homicidal Plan: No Access to Homicidal Means: No Identified Victim: n/a History of harm to others?: No Assessment of Violence: None Noted Violent Behavior Description: none known Does patient have access to weapons?: No Criminal Charges Pending?: No Does patient have a court date: No Is patient on probation?: No  Psychosis Hallucinations: None noted Delusions: None noted  Mental Status Report Appearance/Hygiene: (dressed appropriately for weather) Eye Contact: Poor(patient tearful) Motor Activity: Freedom of movement Speech: Logical/coherent Level of Consciousness: Alert Mood: Depressed, Anxious, Sad, Worthless, low self-esteem Affect: Angry, Anxious, Depressed Anxiety Level: Moderate Thought Processes: Irrelevant, Thought Blocking(suicidal thoughts to kill self ) Judgement: (negative intrusive thoughts to kill self via overdose) Orientation: Person, Place, Time, Situation Obsessive Compulsive Thoughts/Behaviors: Moderate(suicidal thoughts)  Cognitive Functioning Concentration: Poor Memory: Recent Intact, Remote Intact IQ: Average Insight: see judgement above Impulse Control: Poor(previous SI attempt) Sleep: Decreased Vegetative Symptoms: None  ADLScreening [facility] Assessment Services) Patient's cognitive ability adequate to safely complete daily activities?: Yes Patient able to express need for assistance with ADLs?: Yes Independently performs ADLs?: Yes (appropriate for developmental age)  Prior Inpatient Therapy Prior Inpatient Therapy:  No  Prior Outpatient Therapy Prior Outpatient Therapy: Yes Prior Therapy Dates: has not been to therapy in months per patient Prior Therapy Facilty/Provider(s): Dr. Moises Blood Reason for Treatment: mental health  Does patient have an ACCT team?: No Does patient have Intensive In-House Services?  : No Does patient have [facility] services? : No Does patient have P4CC services?: No  ADL Screening (condition at time of admission) Patient's cognitive ability adequate to safely complete daily activities?: Yes Is the patient deaf or have difficulty hearing?: No Does the patient have difficulty seeing, even when wearing glasses/contacts?: No Does the patient have difficulty concentrating, remembering, or making decisions?: No Patient able to express need for assistance with ADLs?: Yes Does the patient have difficulty dressing or bathing?: No Independently performs ADLs?: Yes (appropriate for developmental age) Does the patient have difficulty walking or climbing stairs?: No       Abuse/Neglect Assessment (Assessment to be complete while patient is alone) Abuse/Neglect Assessment Can Be Completed: Yes Physical Abuse: Denies Verbal Abuse: Denies Sexual Abuse: Denies Exploitation of patient/patient's resources: Denies Self-Neglect: Denies     Regulatory affairs officer (For Healthcare) Does Patient Have a Medical Advance Directive?: No Would patient like information on creating a medical advance directive?: No - Patient declined    Additional Information 1:1 In Past 12 Months?: No CIRT Risk: No Elopement Risk: No Does patient have medical clearance?: No     Disposition: Per Shuvon Rankin, NP, patient recommended for inpatient.  Disposition Initial Assessment Completed for this Encounter: Yes Disposition of Patient: Inpatient treatment program, Re-evaluation by Psychiatry recommended(Request medical clearance)  On Site Evaluation by:   Reviewed with Physician:    Despina Hidden 07/28/2017 6:20 PM

## 2017-07-28 NOTE — H&P (Signed)
Behavioral Health Medical Screening Exam  Kimberly Caldwell is an 22 y.o. female presents to [place] as walk-in with complaints of suicidal ideation and plan to overdose on Seroquel.  Also states that she has cocaine abuse history.  Last use "this weekend"  Feeling anxious, nauseous, increased anxiety, stomach cramps restless.  Denies homicidal ideation, psychosis, and paranoia but is unable to contract for safety  Total Time spent with patient: 45 minutes  Psychiatric Specialty Exam: Physical Exam  Vitals reviewed. Constitutional: She is oriented to person, place, and time.  Neck: Normal range of motion. Neck supple.  Cardiovascular:  Tachycardia   Respiratory: Effort normal.  Musculoskeletal: Normal range of motion.  Neurological: She is alert and oriented to person, place, and time.  Skin: Skin is warm and dry.    Review of Systems  Gastrointestinal: Positive for abdominal pain.  Psychiatric/Behavioral: Positive for depression, substance abuse and suicidal ideas. Negative for hallucinations. The patient is nervous/anxious.   All other systems reviewed and are negative.   Blood pressure 105/69, pulse (!) 112, temperature 98.2 F (36.8 C), resp. rate 18, SpO2 99 %.There is no height or weight on file to calculate BMI.  General Appearance: Casual  Eye Contact:  Good  Speech:  Clear and Coherent and Normal Rate  Volume:  Normal  Mood:  Depressed, Hopeless and Irritable  Affect:  Labile and Tearful  Thought Process:  Goal Directed  Orientation:  Full (Time, Place, and Person)  Thought Content:  Denies hallucinations, delusions, and paranoia  Suicidal Thoughts:  Yes.  with intent/plan  Homicidal Thoughts:  No  Memory:  Immediate;   Good Recent;   Good Remote;   Good  Judgement:  Impaired  Insight:  Lacking and Shallow  Psychomotor Activity:  Normal  Concentration: Concentration: Fair and Attention Span: Fair  Recall:  Good  Fund of Knowledge:Good  Language: Good  Akathisia:   No  Handed:  Right  AIMS (if indicated):     Assets:  Communication Skills Desire for Improvement Housing Social Support  Sleep:       Musculoskeletal: Strength & Muscle Tone: within normal limits Gait & Station: normal Patient leans: N/A  Blood pressure 105/69, pulse (!) 112, temperature 98.2 F (36.8 C), resp. rate 18, SpO2 99 %.  Recommendations:  Inpatient psychiatric treatment once medically cleared  Based on my evaluation the patient appears to have an emergency medical condition for which I recommend the patient be transferred to the emergency department for further evaluation.  Donzell Coller, NP 07/28/2017, 5:19 PM

## 2017-07-28 NOTE — ED Notes (Addendum)
TTS [place] states when pt medically cleared, Psych will eval pt for placement at [facility],  Dr Rex Kras notified.

## 2017-07-28 NOTE — ED Provider Notes (Signed)
[facility] DEPT Provider Note   CSN: [id number] Arrival date & time: 07/28/17  1826     History   Chief Complaint Chief Complaint  Patient presents with  . Suicidal    HPI Kimberly Caldwell is a 22 y.o. female.  22yo F w/ PMH including ulcerative colitis, bipolar d/o, anxiety who p/w suicidal ideation.  Patient reports that she felt manic yesterday but then today felt very depressed and has been crying for most of the day.  She has not been able to shake thoughts of wanting to end it all by overdosing on Seroquel.  She tried to see her psychiatrist but was unable to get an appointment so she went to behavioral health, where they recommended that she come here for further evaluation.  She reports poor sleep, abdominal pain related to ulcerative colitis, and a headache that is probably because she has been crying all day.  She denies any HI or hallucinations.  Last hospitalization was when she was 68 and first diagnosed with bipolar disorder.  She has been compliant with her medications and follows regularly with her psychiatrist. She and boyfriend had been abusing drugs but she reports that she is currently in recovery. No alcohol use.   The history is provided by the patient.    Past Medical History:  Diagnosis Date  . Anxiety   . Bipolar disorder ([place])   . Colitis, ulcerative ([place]) 2012   ulceration of colon  . Migraine     Patient Active Problem List   Diagnosis Date Noted  . Cannabis abuse 07/15/2011  . Bipolar I disorder ([place])   . Generalized anxiety disorder   . Mood disorder ([place]) 06/17/2011    Past Surgical History:  Procedure Laterality Date  . WISDOM TOOTH EXTRACTION  2007    OB History    Gravida Para Term Preterm AB Living   0 0 0 0 0 0   SAB TAB Ectopic Multiple Live Births   0 0 0 0 0       Home Medications    Prior to Admission medications   Medication Sig Start Date End Date Taking? Authorizing Provider    azaTHIOprine (IMURAN) 50 MG tablet Take 50 mg by mouth at bedtime.    Yes [provider]  clonazePAM (KLONOPIN) 0.5 MG tablet Take 1 mg by mouth 3 (three) times daily as needed. For anxiety   Yes [provider]  fexofenadine (ALLEGRA) 180 MG tablet Take 180 mg by mouth daily.   Yes [provider]  folic acid (FOLVITE) 1 MG tablet Take 1 mg by mouth daily.   Yes [provider]  levonorgestrel-ethinyl estradiol (ENPRESSE,TRIVORA) tablet Take 1 tablet by mouth daily.   Yes [provider]  Lurasidone HCl 120 MG TABS Take 120 mg by mouth daily with breakfast.    Yes [provider]  ondansetron (ZOFRAN ODT) 8 MG disintegrating tablet Take 1 tablet (8 mg total) by mouth every 8 (eight) hours as needed for nausea or vomiting. 07/02/16  Yes Molpus, John, MD  sulfaSALAzine (AZULFIDINE) 500 MG tablet Take 500 mg by mouth 4 (four) times daily.   Yes [provider]  vedolizumab (ENTYVIO) 300 MG injection Inject 300 mg into the vein. 300 mg infused every 8 weeks 05/18/16  Yes [provider]  metroNIDAZOLE (FLAGYL) 500 MG tablet Take 1 tablet (500 mg total) by mouth 3 (three) times daily. 03/27/16   Palumbo, April, MD  predniSONE (DELTASONE) 20 MG tablet  3 tabs po day one, then 2 po daily x 4 days 03/27/16   Randal Buba, April, MD  traMADol (ULTRAM) 50 MG tablet Take 1 tablet (50 mg total) by mouth every 6 (six) hours as needed. 03/27/16   Palumbo, April, MD    Family History Family History  Problem Relation Age of Onset  . Alcohol abuse Paternal Grandfather     Social History Social History   Tobacco Use  . Smoking status: Current Every Day Smoker    Packs/day: 0.50    Years: 2.00    Pack years: 1.00  . Smokeless tobacco: Never Used  Substance Use Topics  . Alcohol use: Yes  . Drug use: No     Allergies   Dilaudid [hydromorphone hcl] and Hydromorphone   Review of Systems Review of Systems All other systems reviewed  and are negative except that which was mentioned in HPI   Physical Exam Updated Vital Signs BP 114/73 (BP Location: Left Arm)   Pulse 100   Temp 98.4 F (36.9 C) (Oral)   Resp 18   Ht 5' 5"  (1.651 m)   Wt 52.2 kg (115 lb)   LMP 07/13/2017   SpO2 99%   BMI 19.14 kg/m   Physical Exam  Constitutional: She is oriented to person, place, and time. She appears well-developed and well-nourished. No distress.  HENT:  Head: Normocephalic and atraumatic.  Moist mucous membranes  Eyes: Conjunctivae are normal. Pupils are equal, round, and reactive to light.  Neck: Neck supple.  Cardiovascular: Normal rate, regular rhythm and normal heart sounds.  No murmur heard. Pulmonary/Chest: Effort normal and breath sounds normal.  Musculoskeletal: She exhibits no edema.  Neurological: She is alert and oriented to person, place, and time.  Fluent speech  Skin: Skin is warm and dry.  Psychiatric:  Crying, depressed mood, good eye contact, calm and cooperative  Nursing note and vitals reviewed.    ED Treatments / Results  Labs (all labs ordered are listed, but only abnormal results are displayed) Labs Reviewed  COMPREHENSIVE METABOLIC PANEL - Abnormal; Notable for the following components:      Result Value   ALT 13 (*)    Alkaline Phosphatase 36 (*)    All other components within normal limits  ACETAMINOPHEN LEVEL - Abnormal; Notable for the following components:   Acetaminophen (Tylenol), Serum <10 (*)    All other components within normal limits  RAPID URINE DRUG SCREEN, HOSP PERFORMED - Abnormal; Notable for the following components:   Cocaine POSITIVE (*)    Benzodiazepines POSITIVE (*)    Tetrahydrocannabinol POSITIVE (*)    All other components within normal limits  ETHANOL  SALICYLATE LEVEL  CBC  I-STAT BETA HCG BLOOD, ED ([facility], [facility], [facility] ONLY)    EKG  EKG Interpretation None       Radiology No results found.  Procedures Procedures (including critical care  time)  Medications Ordered in ED Medications  sodium chloride 0.9 % bolus 1,000 mL (0 mLs Intravenous Stopped 07/28/17 2242)  diphenhydrAMINE (BENADRYL) injection 25 mg (25 mg Intravenous Given 07/28/17 2147)  prochlorperazine (COMPAZINE) injection 10 mg (10 mg Intravenous Given 07/28/17 2148)  magnesium sulfate IVPB 2 g 50 mL (0 g Intravenous Stopped 07/28/17 2242)     Initial Impression / Assessment and Plan / ED Course  I have reviewed the triage vital signs and the nursing notes.  Pertinent labs & imaging results that were available during my care of the patient were reviewed by me and considered  in my medical decision making (see chart for details).     PT w/ SI with plan. Gave migraine cocktail for headache.  On repeat exam, patient resting comfortably and states her headache improved to 3/10.  Labs show UDS positive for multiple substances but otherwise unremarkable.  She is medically clear for psychiatric evaluation.  TTS has evaluated the patient and recommended placement.  Patient will remain in TCU until a bed is available.  Final Clinical Impressions(s) / ED Diagnoses   Final diagnoses:  None    ED Discharge Orders    None       Maxamilian Amadon, Wenda Overland, MD 07/28/17 2321

## 2017-07-28 NOTE — Progress Notes (Signed)
TTS consulted with Larose Kells, RN. Once pt is medically cleared she will be reviewed by [facility] for possible admission. Horton Chin, RN aware.  Lind Covert, MSW, LCSW Therapeutic Triage Specialist  [phone]

## 2017-07-29 ENCOUNTER — Other Ambulatory Visit: Payer: Self-pay

## 2017-07-29 ENCOUNTER — Inpatient Hospital Stay (HOSPITAL_COMMUNITY)
Admission: AD | Admit: 2017-07-29 | Discharge: 2017-07-31 | DRG: 897 | Disposition: A | Discharge status: Home / Self Care | Payer: BLUE CROSS/BLUE SHIELD | Source: Intra-hospital | Attending: Psychiatry | Admitting: Psychiatry

## 2017-07-29 ENCOUNTER — Encounter (HOSPITAL_COMMUNITY): Payer: Self-pay

## 2017-07-29 DIAGNOSIS — Z818 Family history of other mental and behavioral disorders: Secondary | ICD-10-CM

## 2017-07-29 DIAGNOSIS — F411 Generalized anxiety disorder: Secondary | ICD-10-CM | POA: Diagnosis present

## 2017-07-29 DIAGNOSIS — F1494 Cocaine use, unspecified with cocaine-induced mood disorder: Secondary | ICD-10-CM | POA: Diagnosis not present

## 2017-07-29 DIAGNOSIS — F1210 Cannabis abuse, uncomplicated: Secondary | ICD-10-CM | POA: Diagnosis present

## 2017-07-29 DIAGNOSIS — F1290 Cannabis use, unspecified, uncomplicated: Secondary | ICD-10-CM

## 2017-07-29 DIAGNOSIS — K5190 Ulcerative colitis, unspecified, without complications: Secondary | ICD-10-CM | POA: Diagnosis not present

## 2017-07-29 DIAGNOSIS — F1994 Other psychoactive substance use, unspecified with psychoactive substance-induced mood disorder: Principal | ICD-10-CM | POA: Diagnosis present

## 2017-07-29 DIAGNOSIS — F419 Anxiety disorder, unspecified: Secondary | ICD-10-CM | POA: Diagnosis not present

## 2017-07-29 DIAGNOSIS — F17210 Nicotine dependence, cigarettes, uncomplicated: Secondary | ICD-10-CM | POA: Diagnosis present

## 2017-07-29 DIAGNOSIS — F1490 Cocaine use, unspecified, uncomplicated: Secondary | ICD-10-CM

## 2017-07-29 DIAGNOSIS — F1590 Other stimulant use, unspecified, uncomplicated: Secondary | ICD-10-CM | POA: Diagnosis not present

## 2017-07-29 DIAGNOSIS — F319 Bipolar disorder, unspecified: Secondary | ICD-10-CM | POA: Diagnosis present

## 2017-07-29 DIAGNOSIS — Z23 Encounter for immunization: Secondary | ICD-10-CM

## 2017-07-29 DIAGNOSIS — Z793 Long term (current) use of hormonal contraceptives: Secondary | ICD-10-CM | POA: Diagnosis not present

## 2017-07-29 DIAGNOSIS — G4700 Insomnia, unspecified: Secondary | ICD-10-CM | POA: Diagnosis not present

## 2017-07-29 DIAGNOSIS — R45851 Suicidal ideations: Secondary | ICD-10-CM | POA: Diagnosis present

## 2017-07-29 DIAGNOSIS — Z885 Allergy status to narcotic agent status: Secondary | ICD-10-CM | POA: Diagnosis not present

## 2017-07-29 DIAGNOSIS — Z811 Family history of alcohol abuse and dependence: Secondary | ICD-10-CM | POA: Diagnosis not present

## 2017-07-29 MED ORDER — ONDANSETRON 4 MG PO TBDP
4.0000 mg | ORAL_TABLET | Freq: Four times a day (QID) | ORAL | Status: DC | PRN
  Administered 2017-07-29 – 2017-07-30 (×2): 4 mg via ORAL
  Filled 2017-07-29 (×2): qty 1

## 2017-07-29 MED ORDER — METHOCARBAMOL 500 MG PO TABS: 500.0000 mg | ORAL_TABLET | Freq: Three times a day (TID) | ORAL | Status: DC | PRN

## 2017-07-29 MED ORDER — ALUM & MAG HYDROXIDE-SIMETH 200-200-20 MG/5ML PO SUSP: 30.0000 mL | ORAL | Status: DC | PRN

## 2017-07-29 MED ORDER — ACETAMINOPHEN 325 MG PO TABS: 650.0000 mg | ORAL_TABLET | Freq: Four times a day (QID) | ORAL | Status: DC | PRN

## 2017-07-29 MED ORDER — LOPERAMIDE HCL 2 MG PO CAPS: 2.0000 mg | ORAL_CAPSULE | ORAL | Status: DC | PRN

## 2017-07-29 MED ORDER — TRAZODONE HCL 50 MG PO TABS
50.0000 mg | ORAL_TABLET | Freq: Every evening | ORAL | Status: DC | PRN
  Administered 2017-07-29 – 2017-07-31 (×3): 50 mg via ORAL
  Filled 2017-07-29 (×8): qty 1

## 2017-07-29 MED ORDER — NICOTINE POLACRILEX 2 MG MT GUM
2.0000 mg | CHEWING_GUM | OROMUCOSAL | Status: DC | PRN
Start: 2017-07-29 — End: 2017-07-30
  Administered 2017-07-29: 2 mg via ORAL

## 2017-07-29 MED ORDER — INFLUENZA VAC SPLIT QUAD 0.5 ML IM SUSY
0.5000 mL | PREFILLED_SYRINGE | INTRAMUSCULAR | Status: AC
  Administered 2017-07-30: 0.5 mL via INTRAMUSCULAR
  Filled 2017-07-29: qty 0.5

## 2017-07-29 MED ORDER — NAPROXEN 500 MG PO TABS: 500.0000 mg | ORAL_TABLET | Freq: Two times a day (BID) | ORAL | Status: DC | PRN

## 2017-07-29 MED ORDER — CLONIDINE HCL 0.1 MG PO TABS: 0.1000 mg | ORAL_TABLET | ORAL | Status: DC

## 2017-07-29 MED ORDER — DICYCLOMINE HCL 20 MG PO TABS: 20.0000 mg | ORAL_TABLET | Freq: Four times a day (QID) | ORAL | Status: DC | PRN

## 2017-07-29 MED ORDER — HYDROXYZINE HCL 25 MG PO TABS
25.0000 mg | ORAL_TABLET | Freq: Four times a day (QID) | ORAL | Status: DC | PRN
  Administered 2017-07-29 – 2017-07-31 (×4): 25 mg via ORAL
  Filled 2017-07-29 (×4): qty 1

## 2017-07-29 MED ORDER — CLONIDINE HCL 0.1 MG PO TABS: 0.1000 mg | ORAL_TABLET | Freq: Every day | ORAL | Status: DC

## 2017-07-29 MED ORDER — CLONIDINE HCL 0.1 MG PO TABS
0.1000 mg | ORAL_TABLET | Freq: Four times a day (QID) | ORAL | Status: DC
  Administered 2017-07-29 (×2): 0.1 mg via ORAL
  Filled 2017-07-29 (×7): qty 1

## 2017-07-29 MED ORDER — LURASIDONE HCL 60 MG PO TABS
120.0000 mg | ORAL_TABLET | Freq: Every day | ORAL | Status: DC
  Administered 2017-07-29: 120 mg via ORAL
  Filled 2017-07-29 (×2): qty 2

## 2017-07-29 MED ORDER — LEVONORG-ETH ESTRAD TRIPHASIC PO TABS
1.0000 | ORAL_TABLET | Freq: Every day | ORAL | Status: DC
  Administered 2017-07-29 – 2017-07-31 (×3): 1 via ORAL

## 2017-07-29 MED ORDER — MAGNESIUM HYDROXIDE 400 MG/5ML PO SUSP: 30.0000 mL | Freq: Every day | ORAL | Status: DC | PRN

## 2017-07-29 MED ORDER — SULFASALAZINE 500 MG PO TBEC
500.0000 mg | DELAYED_RELEASE_TABLET | Freq: Two times a day (BID) | ORAL | Status: DC
  Administered 2017-07-29 – 2017-07-31 (×4): 500 mg via ORAL
  Filled 2017-07-29 (×8): qty 1

## 2017-07-29 NOTE — BHH Group Notes (Signed)
[place] LCSW Group Therapy  07/29/2017 2:42 PM  LCSW Group Therapy Note  07/29/2017 10:00am  Type of Therapy and Topic:  Group Therapy: Avoiding Self-Sabotaging and Enabling Behaviors   Participation Level:  Active   Description of Group:   In this group, patients will learn how to identify obstacles, self-sabotaging and enabling behaviors, as well as: what are they, why do we do them and what needs these behaviors meet. Discuss unhealthy relationships and how to have positive healthy boundaries with those that sabotage and enable. Explore aspects of self-sabotage and enabling in yourself and how to limit these self-destructive behaviors in everyday life.    Therapeutic Goals: 1. Patient will identify one obstacle that relates to self-sabotage and enabling behaviors 2. Patient will identify one personal self-sabotaging or enabling behavior they did prior to admission 3. Patient will state a plan to change the above identified behavior 4. Patient will demonstrate ability to communicate their needs through discussion and/or role play.   Summary of Patient Progress:  Kimberly Caldwell participated in discussion with relatable comments, support to other members, and able to give examples how she self sabotages. She plans to start changing her negative thinking into constructive by surrounding herself around people who have the same goals.    Therapeutic Modalities:   Cognitive Behavioral Therapy Person-Centered Therapy Motivational Interviewing     Kimberly Caldwell 07/29/2017 2:42 PM  Summary of Progress/Problems:  Kimberly Caldwell 07/29/2017, 2:42 PM

## 2017-07-29 NOTE — ED Notes (Signed)
Dr Randal Buba states pt to be IVCed.

## 2017-07-29 NOTE — Progress Notes (Signed)
D: Patient is pleasant upon approach.  She was concerned about getting back on her medications today. She reports poor sleep last night, however, she was a new admit to the unit.  She was placed on the clonidine protocol and it was discontinued this afternoon.  She rates her depression as a 7; hopelessness and anxiety as a 6.  Her goal today is to "figure out what to do here."   She has been attending groups and appears engaged with her peers.  She states she has passive thoughts of self harm and contracts for safety.  A: Continue to monitor medication management and MD orders.  Safety checks continued every 15 minutes per protocol.  Offer support and encouragement as needed.  R: Patient is receptive to staff; her behavior is appropriate.

## 2017-07-29 NOTE — Progress Notes (Signed)
D: Pt was in the hallway upon initial approach.  Pt presents with anxious affect and mood.  She reports she had a "good" day and her goal is "just getting out of here as soon as I can, staying positive."  Pt reports she met goal of "staying positive" today.  Pt denies SI/HI, denies hallucinations, denies pain.  Pt has been visible in milieu interacting with peers and staff appropriately.  Pt attended evening group.    A: Introduced self to pt.  Actively listened to pt and offered support and encouragement. Medications administered per order.  PRN medication administered for anxiety and nausea.  Q15 minute safety checks maintained.  R: Pt is safe on the unit.  Pt is compliant with medications.  Pt verbally contracts for safety.  Will continue to monitor and assess.

## 2017-07-29 NOTE — ED Notes (Signed)
[employer] at bedside to serve IVC papers and transport to [facility].

## 2017-07-29 NOTE — BHH Suicide Risk Assessment (Signed)
[facility] Admission Suicide Risk Assessment   Nursing information obtained from:    Demographic factors:    Current Mental Status:    Loss Factors:    Historical Factors:    Risk Reduction Factors:     Total Time spent with patient: 30 minutes Principal Problem: Substance Induced Mood Disorder Diagnosis:   Patient Active Problem List   Diagnosis Date Noted  . Substance induced mood disorder ([employer]) [F19.94] 07/29/2017  . Cannabis abuse [F12.10] 07/15/2011  . Bipolar I disorder ([place]) [F31.9]   . Generalized anxiety disorder [F41.1]   . Mood disorder ([place]) [F39] 06/17/2011   Subjective Data:  22 y.o Caucasian female, employed, lives with her boyfriend. Background history of SUD and mood disorder. Presented to the unit in company of her family. She was eventually committed on account of suicidal thoughts. Patient had expressed plans to OD on Seroquel.  Patient is intoxicated with multiple psychoactive substances. Routine labs are essentially normal. Negative HCG, Toxicology is negative,  UDS is positive for benzodiazepines. THC and cocaine. No alcohol. No past suicidal behavior, no family history of suicide, no evidence of psychosis. No evidence of mania. No cognitive impairment. No access to weapons. She is cooperative with care. She has agreed to treatment recommendations. She has agreed to communicate suicidal thoughts of with staff if the thoughts becomes overwhelming.     Continued Clinical Symptoms:  Alcohol Use Disorder Identification Test Final Score (AUDIT): 2 The "Alcohol Use Disorders Identification Test", Guidelines for Use in Primary Care, Second Edition.  World Pharmacologist [facility]). Score between 0-7:  no or low risk or alcohol related problems. Score between 8-15:  moderate risk of alcohol related problems. Score between 16-19:  high risk of alcohol related problems. Score 20 or above:  warrants further diagnostic evaluation for alcohol dependence and treatment.   CLINICAL  FACTORS:   Alcohol/Substance Abuse/Dependencies   Musculoskeletal: Strength & Muscle Tone: within normal limits Gait & Station: normal Patient leans: N/A  Psychiatric Specialty Exam: Physical Exam  ROS  Blood pressure 114/70, pulse 79, temperature 98.3 F (36.8 C), temperature source Oral, resp. rate 16, height 5' 5"  (1.651 m), weight 52.2 kg (115 lb), last menstrual period 07/13/2017.Body mass index is 19.14 kg/m.  General Appearance: As in H&P  Eye Contact:    Speech:    Volume:    Mood:    Affect:    Thought Process:    Orientation:    Thought Content:    Suicidal Thoughts:    Homicidal Thoughts:  As in H&P  Memory:    Judgement:    Insight:    Psychomotor Activity:    Concentration:    Recall:    Fund of Knowledge:    Language:    Akathisia:    Handed:    AIMS (if indicated):     Assets:  As in H&P  ADL's:    Cognition:    Sleep:  Number of Hours: 1.5      COGNITIVE FEATURES THAT CONTRIBUTE TO RISK:  None    SUICIDE RISK:   Minimal: No identifiable suicidal ideation.  Patients presenting with no risk factors but with morbid ruminations; may be classified as minimal risk based on the severity of the depressive symptoms  PLAN OF CARE:  As in H&P  I certify that inpatient services furnished can reasonably be expected to improve the patient's condition.   Artist [place], MD 07/29/2017, 2:06 PM

## 2017-07-29 NOTE — ED Notes (Signed)
Report to [employer], [facility]. rm 301-1.  Pending serving of IVC papers and transport to [facility].

## 2017-07-29 NOTE — Progress Notes (Signed)
TTS received a phone call from Horton Chin, RN who states EDP Dr. Randal Buba, MD requests to speak with TTS regarding this pt. Per EDP, pt is now requesting to be d/c. Upon review of the chart, pt was seen at [facility] as a walk-in and reported to TTS at that time that she is suicidal with a plan to OD on medication. TTS consulted with Patriciaann Clan, PA who recommends IVC if the pt is refusing VOL due to SI with a plan. EDP Dr. Randal Buba, MD has been informed of IVC recommendation.   Lind Covert, MSW, LCSW Therapeutic Triage Specialist  [phone]

## 2017-07-29 NOTE — Progress Notes (Signed)
Pt is a 22 year old female admitted with Depression and SI with a plan to overdose on seroquel    She contracts for safety on the unit and denies suicidal ideation at this time    She could not identify stressors but has been abusing benzos pot and cocaine   She has been in treatment for depression and bipolar since she was a teen    She has colitis and takes medications for same    She reports being compliant with her medications     Verbal support given   Pt oriented to the unit   She was cooperative and appropriate during the assessment process    Medications administered and effectiveness monitored    Q 15 min checks   Pt is presently safe

## 2017-07-29 NOTE — Plan of Care (Signed)
  Progressing Safety: Periods of time without injury will increase 07/29/2017 2332 - Progressing by Karie Kirks, RN Note Pt has not harmed self or others tonight.  She denies SI/HI and verbally contracts for safety.

## 2017-07-29 NOTE — BHH Group Notes (Signed)
Daily Orientation  Date:  07/29/2017  Time:  8:51 AM  Type of Therapy:  Nurse Education  :  The group is focused on orienting patients to the daily programming schedule for their hall,  explaining  staff responsibilities and establishing therapeutic relationship with patients.  Participation Level:  Active  Participation Quality:  Attentive  Affect:  Appropriate  Cognitive:  Alert  Insight:  Good  Engagement in Group:  Engaged  Modes of Intervention:  Education  Summary of Progress/Problems:  Kimberly Caldwell 07/29/2017, 8:51 AM

## 2017-07-29 NOTE — Plan of Care (Signed)
  Progressing Education: Ability to make informed decisions regarding treatment will improve 07/29/2017 1844 - Progressing by Joice Lofts, RN Coping: Ability to cope will improve 07/29/2017 1844 - Progressing by Joice Lofts, RN Health Behavior/Discharge Planning: Identification of resources available to assist in meeting health care needs will improve 07/29/2017 1844 - Progressing by Joice Lofts, RN

## 2017-07-29 NOTE — ED Notes (Signed)
Pt very tearful, crying stating she does not want to stay in dept.  Pt wants to go home.  Called Dr Randal Buba, states to speak with TTS [place].

## 2017-07-29 NOTE — Tx Team (Signed)
Initial Treatment Plan 07/29/2017 4:06 AM Kimberly Caldwell OMQ:[id number]    PATIENT STRESSORS: Health problems Medication change or noncompliance Substance abuse   PATIENT STRENGTHS: Average or above average intelligence Capable of independent living General fund of knowledge Supportive family/friends   PATIENT IDENTIFIED PROBLEMS: "help with anxiety and depression"  "understand mood changes"                   DISCHARGE CRITERIA:  Improved stabilization in mood, thinking, and/or behavior Verbal commitment to aftercare and medication compliance Withdrawal symptoms are absent or subacute and managed without 24-hour nursing intervention  PRELIMINARY DISCHARGE PLAN: Attend 12-step recovery group Return to previous living arrangement  PATIENT/FAMILY INVOLVEMENT: This treatment plan has been presented to and reviewed with the patient, Kimberly Caldwell, and/or family member, .  The patient and family have been given the opportunity to ask questions and make suggestions.  Migdalia Dk, RN 07/29/2017, 4:06 AM

## 2017-07-29 NOTE — Progress Notes (Signed)
Pt accepted to [facility] 301-1. Attending provider will be Dr. Parke Poisson, MD. Call to report [phone]. Darryll Capers, RN aware of pt's acceptance. Pt is now IVC'd by EDP.  Lind Covert, MSW, LCSW Therapeutic Triage Specialist  [phone]

## 2017-07-29 NOTE — BHH Suicide Risk Assessment (Signed)
[place] INPATIENT:  Family/Significant Other Suicide Prevention Education  Suicide Prevention Education:  Contact Attempts: Joya Gaskins, mother [phone], (name of family member/significant other) has been identified by the patient as the family member/significant other with whom the patient will be residing, and identified as the person(s) who will aid the patient in the event of a mental health crisis.  With written consent from the patient, two attempts were made to provide suicide prevention education, prior to and/or following the patient's discharge.  We were unsuccessful in providing suicide prevention education.  A suicide education pamphlet was given to the patient to share with family/significant other.  Date and time of first attempt: 07/29/17 1514 Date and time of second attempt:  Lilly Cove 07/29/2017, 3:14 PM
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
## 2017-07-29 NOTE — H&P (Signed)
Psychiatric Admission Assessment Adult  Patient Identification: Kimberly Caldwell MRN:  [id number] Date of Evaluation:  07/29/2017 Chief Complaint:  Increased anxiety with suicidal thoughts. Principal Diagnosis: Substance Induced Mood Disorder Diagnosis:   Patient Active Problem List   Diagnosis Date Noted  . Substance induced mood disorder ([place]) [F19.94] 07/29/2017  . Cannabis abuse [F12.10] 07/15/2011  . Bipolar I disorder ([place]) [F31.9]   . Generalized anxiety disorder [F41.1]   . Mood disorder ([place]) [F39] 06/17/2011   History of Present Illness:  22 y.o Caucasian female, employed, lives with her boyfriend. Background history of SUD and mood disorder. Presented to the unit in company of her family. She was eventually committed on account of suicidal thoughts. Patient had expressed plans to OD on Seroquel.  Patient is intoxicated with multiple psychoactive substances. Routine labs are essentially normal. Negative HCG, Toxicology is negative,  UDS is positive for benzodiazepines. THC and cocaine. No alcohol.   At interview, patient reports increased use of cocaine in the past three weeks. Says her boyfriend also uses substances. Patient notes that use makes her more energetic, increases her speed of doing things. Says her thoughts has been racing. She has not been able to sleep at night. Says she has been very irritable. She has been dysphoric to a point she was having suicidal thoughts. She had thought of overdosing on her Seroquel. Says she called her girlfriend who alerted her boyfriend and family. Patient states that she freaked out when she got here and that was how she ended up IVC. Patient says she is now feeling better. She is willing to stay voluntarily. No hallucination in nay modality. No persecutory delusion. No grandiose or any other form of delusion. No passivity of will.  No overwhelming anxiety. No evidence of PTSD.  No thoughts of harming others. No thoughts of violence. No access to  weapons.   Total Time spent with patient: 1 hour  Past Psychiatric History: History of Bipolar Disorder and SUD. She is followed by Dr. Adelene Idler in the community. She is on Latuda 120 mg HS and PRN Clonazepam. She was admitted as a child for rage towards her parents. No past suicidal behavior. Patient has never been in rehab. Says she has been contemplating the idea wit her boyfriend.   Is the patient at risk to self? No.  Has the patient been a risk to self in the past 6 months? No.  Has the patient been a risk to self within the distant past? No.  Is the patient a risk to others? No.  Has the patient been a risk to others in the past 6 months? No.  Has the patient been a risk to others within the distant past? Yes.     Prior Inpatient Therapy:   Prior Outpatient Therapy:    Alcohol Screening: 1. How often do you have a drink containing alcohol?: 2 to 4 times a month 2. How many drinks containing alcohol do you have on a typical day when you are drinking?: 1 or 2 3. How often do you have six or more drinks on one occasion?: Never AUDIT-C Score: 2 4. How often during the last year have you found that you were not able to stop drinking once you had started?: Never 5. How often during the last year have you failed to do what was normally expected from you becasue of drinking?: Never 6. How often during the last year have you needed a first drink in the morning to get yourself going  after a heavy drinking session?: Never 7. How often during the last year have you had a feeling of guilt of remorse after drinking?: Never 8. How often during the last year have you been unable to remember what happened the night before because you had been drinking?: Never 9. Have you or someone else been injured as a result of your drinking?: No 10. Has a relative or friend or a doctor or another health worker been concerned about your drinking or suggested you cut down?: No Alcohol Use Disorder Identification Test  Final Score (AUDIT): 2 Intervention/Follow-up: AUDIT Score <7 follow-up not indicated Substance Abuse History in the last 12 months:  Yes.   Consequences of Substance Abuse: As above Previous Psychotropic Medications: Yes  Psychological Evaluations: Yes  Past Medical History:  Past Medical History:  Diagnosis Date  . Anxiety   . Bipolar disorder ([place])   . Colitis, ulcerative ([place]) 2012   ulceration of colon  . Migraine     Past Surgical History:  Procedure Laterality Date  . WISDOM TOOTH EXTRACTION  2007   Family History:  Family History  Problem Relation Age of Onset  . Alcohol abuse Paternal Grandfather    Family Psychiatric  History:  Bipolar Disorder. No family history of suicide.  Tobacco Screening: Have you used any form of tobacco in the last 30 days? (Cigarettes, Smokeless Tobacco, Cigars, and/or Pipes): Yes Tobacco use, Select all that apply: 5 or more cigarettes per day Are you interested in Tobacco Cessation Medications?: No, patient refused Counseled patient on smoking cessation including recognizing danger situations, developing coping skills and basic information about quitting provided: Refused/Declined practical counseling Social History:  Social History   Substance and Sexual Activity  Alcohol Use Yes     Social History   Substance and Sexual Activity  Drug Use No    Additional Social History: Marital status: Long term relationship Long term relationship, how long?: 3 years What types of issues is patient dealing with in the relationship?: Arguments over substance use and discontinued use.  Reports boyfriend also uses just not as much as patient Are you sexually active?: Yes Does patient have children?: No    Pain Medications: see MAR Prescriptions: see MAR Over the Counter: see MAR History of alcohol / drug use?: Yes Negative Consequences of Use: Personal relationships Name of Substance 1: cocaine 1 - Age of First Use: 18 1 - Amount (size/oz):  unknown 1 - Frequency: every weekend 1 - Duration: ongoing 1 - Last Use / Amount: Friday 07/13/17                  Allergies:   Allergies  Allergen Reactions  . Dilaudid [Hydromorphone Hcl]     Violent nausea   . Hydromorphone Nausea And Vomiting   Lab Results:  Results for orders placed or performed during the hospital encounter of 07/28/17 (from the past 48 hour(s))  Rapid urine drug screen (hospital performed)     Status: Abnormal   Collection Time: 07/28/17  8:24 PM  Result Value Ref Range   Opiates NONE DETECTED NONE DETECTED   Cocaine POSITIVE (A) NONE DETECTED   Benzodiazepines POSITIVE (A) NONE DETECTED   Amphetamines NONE DETECTED NONE DETECTED   Tetrahydrocannabinol POSITIVE (A) NONE DETECTED   Barbiturates NONE DETECTED NONE DETECTED    Comment: (NOTE) DRUG SCREEN FOR MEDICAL PURPOSES ONLY.  IF CONFIRMATION IS NEEDED FOR ANY PURPOSE, NOTIFY LAB WITHIN 5 DAYS. LOWEST DETECTABLE LIMITS FOR URINE DRUG SCREEN Drug Class  Cutoff (ng/mL) Amphetamine and metabolites    1000 Barbiturate and metabolites    200 Benzodiazepine                 970 Tricyclics and metabolites     300 Opiates and metabolites        300 Cocaine and metabolites        300 THC                            50   Comprehensive metabolic panel     Status: Abnormal   Collection Time: 07/28/17  9:15 PM  Result Value Ref Range   Sodium 138 135 - 145 mmol/L   Potassium 3.9 3.5 - 5.1 mmol/L   Chloride 107 101 - 111 mmol/L   CO2 23 22 - 32 mmol/L   Glucose, Bld 87 65 - 99 mg/dL   BUN 9 6 - 20 mg/dL   Creatinine, Ser 0.73 0.44 - 1.00 mg/dL   Calcium 8.9 8.9 - 10.3 mg/dL   Total Protein 7.6 6.5 - 8.1 g/dL   Albumin 4.2 3.5 - 5.0 g/dL   AST 19 15 - 41 U/L   ALT 13 (L) 14 - 54 U/L   Alkaline Phosphatase 36 (L) 38 - 126 U/L   Total Bilirubin 0.3 0.3 - 1.2 mg/dL   GFR calc non Af Amer >60 >60 mL/min   GFR calc Af Amer >60 >60 mL/min    Comment: (NOTE) The eGFR has been  calculated using the CKD EPI equation. This calculation has not been validated in all clinical situations. eGFR's persistently <60 mL/min signify possible Chronic Kidney Disease.    Anion gap 8 5 - 15  Ethanol     Status: None   Collection Time: 07/28/17  9:15 PM  Result Value Ref Range   Alcohol, Ethyl (B) <10 <10 mg/dL    Comment:        LOWEST DETECTABLE LIMIT FOR SERUM ALCOHOL IS 10 mg/dL FOR MEDICAL PURPOSES ONLY   Salicylate level     Status: None   Collection Time: 07/28/17  9:15 PM  Result Value Ref Range   Salicylate Lvl <2.6 2.8 - 30.0 mg/dL  Acetaminophen level     Status: Abnormal   Collection Time: 07/28/17  9:15 PM  Result Value Ref Range   Acetaminophen (Tylenol), Serum <10 (L) 10 - 30 ug/mL    Comment:        THERAPEUTIC CONCENTRATIONS VARY SIGNIFICANTLY. A RANGE OF 10-30 ug/mL MAY BE AN EFFECTIVE CONCENTRATION FOR MANY PATIENTS. HOWEVER, SOME ARE BEST TREATED AT CONCENTRATIONS OUTSIDE THIS RANGE. ACETAMINOPHEN CONCENTRATIONS >150 ug/mL AT 4 HOURS AFTER INGESTION AND >50 ug/mL AT 12 HOURS AFTER INGESTION ARE OFTEN ASSOCIATED WITH TOXIC REACTIONS.   cbc     Status: None   Collection Time: 07/28/17  9:15 PM  Result Value Ref Range   WBC 8.3 4.0 - 10.5 K/uL   RBC 4.71 3.87 - 5.11 MIL/uL   Hemoglobin 15.0 12.0 - 15.0 g/dL   HCT 44.1 36.0 - 46.0 %   MCV 93.6 78.0 - 100.0 fL   MCH 31.8 26.0 - 34.0 pg   MCHC 34.0 30.0 - 36.0 g/dL   RDW 13.5 11.5 - 15.5 %   Platelets 272 150 - 400 K/uL  I-Stat beta hCG blood, ED     Status: None   Collection Time: 07/28/17  9:36 PM  Result Value Ref Range   I-stat  hCG, quantitative <5.0 <5 mIU/mL   Comment 3            Comment:   GEST. AGE      CONC.  (mIU/mL)   <=1 WEEK        5 - 50     2 WEEKS       50 - 500     3 WEEKS       100 - 10,000     4 WEEKS     1,000 - 30,000        FEMALE AND NON-PREGNANT FEMALE:     LESS THAN 5 mIU/mL     Blood Alcohol level:  Lab Results  Component Value Date   ETH <10  40/98/1191    Metabolic Disorder Labs:  No results found for: HGBA1C, MPG No results found for: PROLACTIN Lab Results  Component Value Date   CHOL  10/01/2010    127        ATP III CLASSIFICATION:  <200     mg/dL   Desirable  200-239  mg/dL   Borderline High  >=240    mg/dL   High          TRIG 129 10/01/2010    Current Medications: Current Facility-Administered Medications  Medication Dose Route Frequency Provider Last Rate Last Dose  . acetaminophen (TYLENOL) tablet 650 mg  650 mg Oral Q6H PRN Laverle Hobby, PA-C      . alum & mag hydroxide-simeth (MAALOX/MYLANTA) 200-200-20 MG/5ML suspension 30 mL  30 mL Oral Q4H PRN Laverle Hobby, PA-C      . cloNIDine (CATAPRES) tablet 0.1 mg  0.1 mg Oral QID Patriciaann Clan E, PA-C   0.1 mg at 07/29/17 1157   Followed by  . [START ON 07/31/2017] cloNIDine (CATAPRES) tablet 0.1 mg  0.1 mg Oral BH-qamhs Simon, Spencer E, PA-C       Followed by  . [START ON 08/02/2017] cloNIDine (CATAPRES) tablet 0.1 mg  0.1 mg Oral QAC breakfast Laverle Hobby, PA-C      . dicyclomine (BENTYL) tablet 20 mg  20 mg Oral Q6H PRN Laverle Hobby, PA-C      . hydrOXYzine (ATARAX/VISTARIL) tablet 25 mg  25 mg Oral Q6H PRN Patriciaann Clan E, PA-C   25 mg at 07/29/17 0400  . [START ON 07/30/2017] Influenza vac split quadrivalent PF (FLUARIX) injection 0.5 mL  0.5 mL Intramuscular Tomorrow-1000 Simon, Spencer E, PA-C      . loperamide (IMODIUM) capsule 2-4 mg  2-4 mg Oral PRN Patriciaann Clan E, PA-C      . magnesium hydroxide (MILK OF MAGNESIA) suspension 30 mL  30 mL Oral Daily PRN Laverle Hobby, PA-C      . methocarbamol (ROBAXIN) tablet 500 mg  500 mg Oral Q8H PRN Laverle Hobby, PA-C      . naproxen (NAPROSYN) tablet 500 mg  500 mg Oral BID PRN Laverle Hobby, PA-C      . ondansetron (ZOFRAN-ODT) disintegrating tablet 4 mg  4 mg Oral Q6H PRN Laverle Hobby, PA-C      . traZODone (DESYREL) tablet 50 mg  50 mg Oral QHS,MR X 1 Simon, Spencer E, PA-C        PTA Medications: Medications Prior to Admission  Medication Sig Dispense Refill Last Dose  . azaTHIOprine (IMURAN) 50 MG tablet Take 50 mg by mouth at bedtime.    07/27/2017 at Unknown time  . clonazePAM (KLONOPIN) 0.5 MG tablet  Take 1 mg by mouth 3 (three) times daily as needed. For anxiety   07/28/2017 at Unknown time  . fexofenadine (ALLEGRA) 180 MG tablet Take 180 mg by mouth daily.   07/27/2017 at Unknown time  . folic acid (FOLVITE) 1 MG tablet Take 1 mg by mouth daily.   07/27/2017 at Unknown time  . levonorgestrel-ethinyl estradiol (ENPRESSE,TRIVORA) tablet Take 1 tablet by mouth daily.   07/27/2017 at Unknown time  . Lurasidone HCl 120 MG TABS Take 120 mg by mouth daily with breakfast.    07/27/2017 at Unknown time  . metroNIDAZOLE (FLAGYL) 500 MG tablet Take 1 tablet (500 mg total) by mouth 3 (three) times daily. 30 tablet 0   . ondansetron (ZOFRAN ODT) 8 MG disintegrating tablet Take 1 tablet (8 mg total) by mouth every 8 (eight) hours as needed for nausea or vomiting. 10 tablet 0 07/28/2017 at Unknown time  . predniSONE (DELTASONE) 20 MG tablet 3 tabs po day one, then 2 po daily x 4 days 11 tablet 0   . sulfaSALAzine (AZULFIDINE) 500 MG tablet Take 500 mg by mouth 4 (four) times daily.   07/27/2017 at Unknown time  . traMADol (ULTRAM) 50 MG tablet Take 1 tablet (50 mg total) by mouth every 6 (six) hours as needed. 9 tablet 0   . vedolizumab (ENTYVIO) 300 MG injection Inject 300 mg into the vein. 300 mg infused every 8 weeks   06/03/2017    Musculoskeletal: Strength & Muscle Tone: within normal limits Gait & Station: normal Patient leans: N/A  Psychiatric Specialty Exam: Physical Exam  Constitutional: She is oriented to person, place, and time. She appears well-developed and well-nourished.  HENT:  Head: Normocephalic and atraumatic.  Respiratory: Effort normal.  Neurological: She is alert and oriented to person, place, and time.  Psychiatric:  As above    ROS  Blood pressure  114/70, pulse 79, temperature 98.3 F (36.8 C), temperature source Oral, resp. rate 16, height 5' 5"  (1.651 m), weight 52.2 kg (115 lb), last menstrual period 07/13/2017.Body mass index is 19.14 kg/m.  General Appearance: Neatly dressed, not in any distress. Good relatedness. Not internally distracted.   Eye Contact:  Good  Speech:  Clear and Coherent and Normal Rate  Volume:  Normal  Mood:  Feels much better  Affect:  Appropriate and Full Range  Thought Process:  Linear  Orientation:  Full (Time, Place, and Person)  Thought Content:  No delusional theme. No preoccupation with violent thoughts. No negative ruminations. No obsession.  No hallucination in any modality.   Suicidal Thoughts:  No  Homicidal Thoughts:  No  Memory:  Immediate;   Good Recent;   Good Remote;   Good  Judgement:  Fair  Insight:  Fair  Psychomotor Activity:  Normal  Concentration:  Concentration: Good and Attention Span: Good  Recall:  Good  Fund of Knowledge:  Good  Language:  Good  Akathisia:  Negative  Handed:    AIMS (if indicated):     Assets:  Communication Skills Desire for Improvement Financial Resources/Insurance Housing Intimacy Leisure Time Resilience Transportation Vocational/Educational  ADL's:  Intact  Cognition:  WNL  Sleep:  Number of Hours: 1.5    Treatment Plan Summary: Patient is coming off multiple psychoactive substances. Mood has improved compared to presentation. She has better insight now. She has agreed to recommended treatment. We plan to recommence her mood stabilizer and evaluate her further.  Psychiatric: Substance Induced Mood Disorder Bipolar Disorder  Medical: Ulcerative colitis  Psychosocial:   PLAN: 1. Latuda 120 mg HS 2. Continue home medical medications at home dose 3. Encourage unit groups and therapeutic activities 4. Monitor mood, behavior and interaction with peers 5. Motivational enhancement  6. SW would gather collateral from her family and  coordinate aftercare 7. Allow voluntary status.    Observation Level/Precautions:  15 minute checks  Laboratory:    Psychotherapy:    Medications:    Consultations:    Discharge Concerns:    Estimated LOS:  Other:     Physician Treatment Plan for Primary Diagnosis: <principal problem not specified> Long Term Goal(s): Improvement in symptoms so as ready for discharge  Short Term Goals: Ability to identify changes in lifestyle to reduce recurrence of condition will improve, Ability to verbalize feelings will improve, Ability to disclose and discuss suicidal ideas, Ability to demonstrate self-control will improve, Ability to identify and develop effective coping behaviors will improve, Ability to maintain clinical measurements within normal limits will improve, Compliance with prescribed medications will improve and Ability to identify triggers associated with substance abuse/mental health issues will improve  Physician Treatment Plan for Secondary Diagnosis: Active Problems:   Substance induced mood disorder ([place])  Long Term Goal(s): Improvement in symptoms so as ready for discharge  Short Term Goals: Ability to identify changes in lifestyle to reduce recurrence of condition will improve, Ability to verbalize feelings will improve, Ability to disclose and discuss suicidal ideas, Ability to demonstrate self-control will improve, Ability to identify and develop effective coping behaviors will improve, Ability to maintain clinical measurements within normal limits will improve, Compliance with prescribed medications will improve and Ability to identify triggers associated with substance abuse/mental health issues will improve  I certify that inpatient services furnished can reasonably be expected to improve the patient's condition.    [place], MD 1/17/20191:27 PM

## 2017-07-30 DIAGNOSIS — F319 Bipolar disorder, unspecified: Secondary | ICD-10-CM

## 2017-07-30 DIAGNOSIS — F1494 Cocaine use, unspecified with cocaine-induced mood disorder: Secondary | ICD-10-CM

## 2017-07-30 DIAGNOSIS — F419 Anxiety disorder, unspecified: Secondary | ICD-10-CM

## 2017-07-30 DIAGNOSIS — G4700 Insomnia, unspecified: Secondary | ICD-10-CM

## 2017-07-30 DIAGNOSIS — Z811 Family history of alcohol abuse and dependence: Secondary | ICD-10-CM

## 2017-07-30 DIAGNOSIS — Z793 Long term (current) use of hormonal contraceptives: Secondary | ICD-10-CM

## 2017-07-30 MED ORDER — LURASIDONE HCL 60 MG PO TABS
120.0000 mg | ORAL_TABLET | Freq: Every day | ORAL | Status: DC
  Administered 2017-07-30: 120 mg via ORAL
  Filled 2017-07-30 (×3): qty 2

## 2017-07-30 MED ORDER — NICOTINE 21 MG/24HR TD PT24
21.0000 mg | MEDICATED_PATCH | Freq: Every day | TRANSDERMAL | Status: DC
  Administered 2017-07-30 – 2017-07-31 (×2): 21 mg via TRANSDERMAL
  Filled 2017-07-30 (×4): qty 1

## 2017-07-30 NOTE — [facility] Group Notes (Signed)
LCSW Group Therapy Note  07/30/2017 1:15pm  Type of Therapy/Topic:  Group Therapy:  Emotion Regulation  Participation Level:  Active   Description of Group:   The purpose of this group is to assist patients in learning to regulate negative emotions and experience positive emotions. Patients will be guided to discuss ways in which they have been vulnerable to their negative emotions. These vulnerabilities will be juxtaposed with experiences of positive emotions or situations, and patients will be challenged to use positive emotions to combat negative ones. Special emphasis will be placed on coping with negative emotions in conflict situations, and patients will process healthy conflict resolution skills.  Therapeutic Goals: 1. Patient will identify two positive emotions or experiences to reflect on in order to balance out negative emotions 2. Patient will label two or more emotions that they find the most difficult to experience 3. Patient will demonstrate positive conflict resolution skills through discussion and/or role plays  Summary of Patient Progress:  Stayed the entire time, engaged throughout.  Talked about how she has begun practicing breathing exercises since admission, and how she is finding that useful.  Participated in breathing exercise during group, and described the "relaxed" feeling she had at the end.     Therapeutic Modalities:   Cognitive Behavioral Therapy Feelings Identification Dialectical Behavioral Therapy   Trish Mage, LCSW 07/30/2017 2:21 PM

## 2017-07-30 NOTE — Tx Team (Signed)
Interdisciplinary Treatment and Diagnostic Plan Update  07/30/2017 Time of Session: 10:46 AM  Syndey Caldwell MRN: [id number]  Principal Diagnosis: Substance induced mood disorder ([place])  Secondary Diagnoses: Principal Problem:   Substance induced mood disorder ([place]) Active Problems:   Bipolar I disorder ([place])   Current Medications:  Current Facility-Administered Medications  Medication Dose Route Frequency Provider Last Rate Last Dose  . acetaminophen (TYLENOL) tablet 650 mg  650 mg Oral Q6H PRN Laverle Hobby, PA-C      . alum & mag hydroxide-simeth (MAALOX/MYLANTA) 200-200-20 MG/5ML suspension 30 mL  30 mL Oral Q4H PRN Patriciaann Clan E, PA-C      . hydrOXYzine (ATARAX/VISTARIL) tablet 25 mg  25 mg Oral Q6H PRN Patriciaann Clan E, PA-C   25 mg at 07/29/17 2005  . Influenza vac split quadrivalent PF (FLUARIX) injection 0.5 mL  0.5 mL Intramuscular Tomorrow-1000 Simon, Spencer E, PA-C      . levonorgestrel-ethinyl estradiol (ENPRESSE,TRIVORA) per tablet 1 tablet  1 tablet Oral Daily Cobos, Myer Peer, MD   1 tablet at 07/30/17 0817  . Lurasidone HCl TABS 120 mg  120 mg Oral QHS Izediuno, Laruth Bouchard, MD   120 mg at 07/29/17 2101  . magnesium hydroxide (MILK OF MAGNESIA) suspension 30 mL  30 mL Oral Daily PRN Patriciaann Clan E, PA-C      . naproxen (NAPROSYN) tablet 500 mg  500 mg Oral BID PRN Patriciaann Clan E, PA-C      . nicotine (NICODERM CQ - dosed in mg/24 hours) patch 21 mg  21 mg Transdermal Daily Pennelope Bracken, MD   21 mg at 07/30/17 0837  . ondansetron (ZOFRAN-ODT) disintegrating tablet 4 mg  4 mg Oral Q6H PRN Laverle Hobby, PA-C   4 mg at 07/29/17 2128  . sulfaSALAzine (AZULFIDINE) EC tablet 500 mg  500 mg Oral BID Artist Beach, MD   500 mg at 07/30/17 0817  . traZODone (DESYREL) tablet 50 mg  50 mg Oral QHS,MR X 1 Laverle Hobby, PA-C   50 mg at 07/29/17 2101    PTA Medications: Medications Prior to Admission  Medication Sig Dispense Refill Last Dose  .  azaTHIOprine (IMURAN) 50 MG tablet Take 50 mg by mouth at bedtime.    07/27/2017 at Unknown time  . clonazePAM (KLONOPIN) 0.5 MG tablet Take 1 mg by mouth 3 (three) times daily as needed. For anxiety   07/28/2017 at Unknown time  . fexofenadine (ALLEGRA) 180 MG tablet Take 180 mg by mouth daily.   07/27/2017 at Unknown time  . folic acid (FOLVITE) 1 MG tablet Take 1 mg by mouth daily.   07/27/2017 at Unknown time  . levonorgestrel-ethinyl estradiol (ENPRESSE,TRIVORA) tablet Take 1 tablet by mouth daily.   07/27/2017 at Unknown time  . Lurasidone HCl 120 MG TABS Take 120 mg by mouth daily with breakfast.    07/27/2017 at Unknown time  . metroNIDAZOLE (FLAGYL) 500 MG tablet Take 1 tablet (500 mg total) by mouth 3 (three) times daily. 30 tablet 0   . ondansetron (ZOFRAN ODT) 8 MG disintegrating tablet Take 1 tablet (8 mg total) by mouth every 8 (eight) hours as needed for nausea or vomiting. 10 tablet 0 07/28/2017 at Unknown time  . predniSONE (DELTASONE) 20 MG tablet 3 tabs po day one, then 2 po daily x 4 days 11 tablet 0   . sulfaSALAzine (AZULFIDINE) 500 MG tablet Take 500 mg by mouth 4 (four) times daily.   07/27/2017 at Unknown time  .  traMADol (ULTRAM) 50 MG tablet Take 1 tablet (50 mg total) by mouth every 6 (six) hours as needed. 9 tablet 0   . vedolizumab (ENTYVIO) 300 MG injection Inject 300 mg into the vein. 300 mg infused every 8 weeks   06/03/2017    Patient Stressors: Health problems Medication change or noncompliance Substance abuse  Patient Strengths: Average or above average intelligence Capable of independent living General fund of knowledge Supportive family/friends  Treatment Modalities: Medication Management, Group therapy, Case management,  1 to 1 session with clinician, Psychoeducation, Recreational therapy.   Physician Treatment Plan for Primary Diagnosis: Substance induced mood disorder ([place]) Long Term Goal(s): Improvement in symptoms so as ready for discharge  Short Term  Goals: Ability to identify changes in lifestyle to reduce recurrence of condition will improve Ability to verbalize feelings will improve Ability to disclose and discuss suicidal ideas Ability to demonstrate self-control will improve Ability to identify and develop effective coping behaviors will improve Ability to maintain clinical measurements within normal limits will improve Compliance with prescribed medications will improve Ability to identify triggers associated with substance abuse/mental health issues will improve Ability to identify changes in lifestyle to reduce recurrence of condition will improve Ability to verbalize feelings will improve Ability to disclose and discuss suicidal ideas Ability to demonstrate self-control will improve Ability to identify and develop effective coping behaviors will improve Ability to maintain clinical measurements within normal limits will improve Compliance with prescribed medications will improve Ability to identify triggers associated with substance abuse/mental health issues will improve  Medication Management: Evaluate patient's response, side effects, and tolerance of medication regimen.  Therapeutic Interventions: 1 to 1 sessions, Unit Group sessions and Medication administration.  Evaluation of Outcomes: Progressing  Physician Treatment Plan for Secondary Diagnosis: Principal Problem:   Substance induced mood disorder ([place]) Active Problems:   Bipolar I disorder ([place])   Long Term Goal(s): Improvement in symptoms so as ready for discharge  Short Term Goals: Ability to identify changes in lifestyle to reduce recurrence of condition will improve Ability to verbalize feelings will improve Ability to disclose and discuss suicidal ideas Ability to demonstrate self-control will improve Ability to identify and develop effective coping behaviors will improve Ability to maintain clinical measurements within normal limits will  improve Compliance with prescribed medications will improve Ability to identify triggers associated with substance abuse/mental health issues will improve Ability to identify changes in lifestyle to reduce recurrence of condition will improve Ability to verbalize feelings will improve Ability to disclose and discuss suicidal ideas Ability to demonstrate self-control will improve Ability to identify and develop effective coping behaviors will improve Ability to maintain clinical measurements within normal limits will improve Compliance with prescribed medications will improve Ability to identify triggers associated with substance abuse/mental health issues will improve  Medication Management: Evaluate patient's response, side effects, and tolerance of medication regimen.  Therapeutic Interventions: 1 to 1 sessions, Unit Group sessions and Medication administration.  Evaluation of Outcomes: Progressing   RN Treatment Plan for Primary Diagnosis: Substance induced mood disorder ([place]) Long Term Goal(s): Knowledge of disease and therapeutic regimen to maintain health will improve  Short Term Goals: Ability to identify and develop effective coping behaviors will improve and Compliance with prescribed medications will improve  Medication Management: RN will administer medications as ordered by provider, will assess and evaluate patient's response and provide education to patient for prescribed medication. RN will report any adverse and/or side effects to prescribing provider.  Therapeutic Interventions: 1 on 1 counseling sessions,  Psychoeducation, Medication administration, Evaluate responses to treatment, Monitor vital signs and CBGs as ordered, Perform/monitor CIWA, COWS, AIMS and Fall Risk screenings as ordered, Perform wound care treatments as ordered.  Evaluation of Outcomes: Progressing   LCSW Treatment Plan for Primary Diagnosis: Substance induced mood disorder ([place]) Long Term Goal(s):  Safe transition to appropriate next level of care at discharge, Engage patient in therapeutic group addressing interpersonal concerns.  Short Term Goals: Engage patient in aftercare planning with referrals and resources  Therapeutic Interventions: Assess for all discharge needs, 1 to 1 time with Social worker, Explore available resources and support systems, Assess for adequacy in community support network, Educate family and significant other(s) on suicide prevention, Complete Psychosocial Assessment, Interpersonal group therapy.  Evaluation of Outcomes: Met  Return home, follow up [place] in Treatment: Attending groups: Yes Participating in groups: Yes Taking medication as prescribed: Yes Toleration medication: Yes, no side effects reported at this time Family/Significant other contact made: No Patient understands diagnosis: Yes AEB asking for help with coping skills Discussing patient identified problems/goals with staff: Yes Medical problems stabilized or resolved: Yes Denies suicidal/homicidal ideation: Yes Issues/concerns per patient self-inventory: None Other: N/A  New problem(s) identified: None identified at this time.   New Short Term/Long Term Goal(s): "I was in a weird head space-it was negative.  My goal is to stay more positive, learn to manage my stress, not take it out on myself,  And make sure I'm really ready to go before I leave here."  Discharge Plan or Barriers:   Reason for Continuation of Hospitalization: Anxiety  Depression  Medication stabilization  Withdrawal symptoms  Estimated Length of Stay: 1/23  Attendees: Patient: Kimberly Caldwell 07/30/2017  10:46 AM  Physician:V Izediuno, MD 07/30/2017  10:46 AM  Nursing: Leanne Lovely, RN 07/30/2017  10:46 AM  RN Care Manager: Lars Pinks, RN 07/30/2017  10:46 AM  Social Worker: Ripley Fraise 07/30/2017  10:46 AM  Recreational Therapist: Winfield Cunas 07/30/2017  10:46 AM  Other: Darnelle Maffucci Money  07/30/2017  10:46 AM  Other:  07/30/2017  10:46 AM    Scribe for Treatment Team:  Roque Lias LCSW 07/30/2017 10:46 AM

## 2017-07-30 NOTE — Progress Notes (Signed)
D: Pt was in the dayroom upon initial approach.  Pt presents with appropriate affect and anxious mood.  She reports her day was "good" and she "had a little meltdown earlier but I got some medication for it and I was able to breathe myself out of it, I'm feeling better now."  Reports she had a "great" visit with friends tonight.  Pt denies SI/HI, denies hallucinations, denies pain.  Pt has been visible in milieu interacting with peers and staff appropriately.  Pt attended evening group.    A: Introduced self to pt.  Actively listened to pt and offered support and encouragement. Medication administered per order.  Q15 minute safety checks maintained.  R: Pt is safe on the unit.  Pt is compliant with medication.  Pt verbally contracts for safety.  Will continue to monitor and assess.

## 2017-07-30 NOTE — Progress Notes (Signed)
Recreation Therapy Notes  Date: 07/30/17 Time: 0930 Location: [address] Dayroom  Group Topic: Stress Management  Goal Area(s) Addresses:  Patient will verbalize importance of using healthy stress management.  Patient will identify positive emotions associated with healthy stress management.   Behavioral Response: Engaged  Intervention: Stress Management  Activity :  Meditation.  LRT introduced the stress management technique of meditation.  LRT played a meditation dealing with gratitude.  Patients were to listen and follow along as the meditation played.  Education:  Stress Management, Discharge Planning.   Education Outcome: Acknowledges edcuation/In group clarification offered/Needs additional education  Clinical Observations/Feedback: Pt attended group.    Victorino Sparrow, LRT/CTRS     Victorino Sparrow A 07/30/2017 1:04 PM

## 2017-07-30 NOTE — Progress Notes (Signed)
Data. Patient denies SI/HI/AVH. Verbally contracts for safety on the unit and to come to staff before acting of any self harm thoughts/feelings.  Patient interacting well with staff and other patients. In the afternoon patient reported she was feeling "Anxious and received a PRN medication and was assisted through a relaxation exercise and a guided imagery exercise. Patient was able to identify , "I am upset that a lot of my friends left today." Patient became tearful and went to her room and was able to work through her feelings appropriately. On her self assessment patient reported 3/10 for anxiety and depression and 2/10 for hopelessness. Her goal for today is, "Taking in the day and making the most out of it."  Action. Emotional support and encouragement offered. Education provided on medication, indications and side effect. Q 15 minute checks done for safety. Response. Safety on the unit maintained through 15 minute checks.  Medications taken as prescribed. Attended groups. Remained appropriate through out shift.

## 2017-07-30 NOTE — [facility] Group Notes (Signed)
Pt attended AA group this evening. Pt participated in [place] group.

## 2017-07-30 NOTE — Progress Notes (Signed)
  [facility] Adult Case Management Discharge Plan :  Will you be returning to the same living situation after discharge:  Yes,  home At discharge, do you have transportation home?: Yes,  familyfamily Do you have the ability to pay for your medications: Yes,  insurance  Release of information consent forms completed and in the chart;  Patient's signature needed at discharge.  Patient to Follow up at: Follow-up Information    Group, [facility] Follow up on 07/29/2017.   Specialty:  Behavioral Health Why:  Dr. Adelene Idler Medication Management appointment  March Contact information: [place] [address] [place] [address] [place], [place] Follow up on 08/03/2017.   Specialty:  Behavioral Health Why:  Appointment for therapy/intake appointment at 12:00. Please arrive 10-15 minutes ahead of time for paperwork.  Bring ID card and insurance.  If this time does not work for you, please call and reschedule time! Contact information: [place] [address] [phone]           Next level of care provider has access to [place] and Suicide Prevention discussed: Yes,  yes  Have you used any form of tobacco in the last 30 days? (Cigarettes, Smokeless Tobacco, Cigars, and/or Pipes): Yes  Has patient been referred to the Quitline?: Patient refused referral  Patient has been referred for addiction treatment: Yes  Trish Mage, LCSW 07/30/2017, 2:42 PM

## 2017-07-30 NOTE — Progress Notes (Signed)
[facility] MD Progress Note  07/30/2017 12:34 PM Kimberly Caldwell  MRN:  [id number]   Subjective:  Patient reports that she is doing really good today. She slept well and has a good appetite. She denies any withdrawal symptoms. She reports planning to go to IOP for continued treatment. Also her and her boyfriend are going to go to [employer] after discharge. She denies any SI/HI/AVH and contracts for safety. She denies any medication side effects and that the [place] works really good for her as long as she remains clean and sober form drugs.  Objective: Patient's chart and findings reviewed and discussed with treatment team. Patient is pleasant and cooperative. She has been attending group. She has been compliant with treatment and seems invested. She is future oriented as well. She will continue current medication regimen.   Principal Problem: Substance induced mood disorder ([place]) Diagnosis:   Patient Active Problem List   Diagnosis Date Noted  . Substance induced mood disorder ([place]) [F19.94] 07/29/2017  . Cannabis abuse [F12.10] 07/15/2011  . Bipolar I disorder ([place]) [F31.9]   . Generalized anxiety disorder [F41.1]   . Mood disorder ([place]) [F39] 06/17/2011   Total Time spent with patient: 15 minutes  Past Psychiatric History: See H&P  Past Medical History:  Past Medical History:  Diagnosis Date  . Anxiety   . Bipolar disorder ([place])   . Colitis, ulcerative ([place]) 2012   ulceration of colon  . Migraine     Past Surgical History:  Procedure Laterality Date  . WISDOM TOOTH EXTRACTION  2007   Family History:  Family History  Problem Relation Age of Onset  . Alcohol abuse Paternal Grandfather    Family Psychiatric  History: See H&P Social History:  Social History   Substance and Sexual Activity  Alcohol Use Yes     Social History   Substance and Sexual Activity  Drug Use No    Social History   Socioeconomic History  . Marital status: Single    Spouse name: None  . Number of  children: None  . Years of education: None  . Highest education level: None  Social Needs  . Financial resource strain: None  . Food insecurity - worry: None  . Food insecurity - inability: None  . Transportation needs - medical: None  . Transportation needs - non-medical: None  Occupational History  . Occupation: student    Comment: 12th grade home-schooled  Tobacco Use  . Smoking status: Current Every Day Smoker    Packs/day: 0.50    Years: 2.00    Pack years: 1.00  . Smokeless tobacco: Never Used  Substance and Sexual Activity  . Alcohol use: Yes  . Drug use: No  . Sexual activity: Yes    Partners: Male    Birth control/protection: Pill  Other Topics Concern  . None  Social History Narrative  . None   Additional Social History:    Pain Medications: see MAR Prescriptions: see MAR Over the Counter: see MAR History of alcohol / drug use?: Yes Negative Consequences of Use: Personal relationships Name of Substance 1: cocaine 1 - Age of First Use: 18 1 - Amount (size/oz): unknown 1 - Frequency: every weekend 1 - Duration: ongoing 1 - Last Use / Amount: Friday 07/13/17                  Sleep: Good  Appetite:  Good  Current Medications: Current Facility-Administered Medications  Medication Dose Route Frequency Provider Last Rate Last Dose  .  acetaminophen (TYLENOL) tablet 650 mg  650 mg Oral Q6H PRN Laverle Hobby, PA-C      . alum & mag hydroxide-simeth (MAALOX/MYLANTA) 200-200-20 MG/5ML suspension 30 mL  30 mL Oral Q4H PRN Patriciaann Clan E, PA-C      . hydrOXYzine (ATARAX/VISTARIL) tablet 25 mg  25 mg Oral Q6H PRN Patriciaann Clan E, PA-C   25 mg at 07/29/17 2005  . Influenza vac split quadrivalent PF (FLUARIX) injection 0.5 mL  0.5 mL Intramuscular Tomorrow-1000 Simon, Spencer E, PA-C      . levonorgestrel-ethinyl estradiol (ENPRESSE,TRIVORA) per tablet 1 tablet  1 tablet Oral Daily Cobos, Myer Peer, MD   1 tablet at 07/30/17 0817  . Lurasidone HCl TABS 120  mg  120 mg Oral QHS Izediuno, Laruth Bouchard, MD   120 mg at 07/29/17 2101  . magnesium hydroxide (MILK OF MAGNESIA) suspension 30 mL  30 mL Oral Daily PRN Patriciaann Clan E, PA-C      . naproxen (NAPROSYN) tablet 500 mg  500 mg Oral BID PRN Patriciaann Clan E, PA-C      . nicotine (NICODERM CQ - dosed in mg/24 hours) patch 21 mg  21 mg Transdermal Daily Pennelope Bracken, MD   21 mg at 07/30/17 0837  . ondansetron (ZOFRAN-ODT) disintegrating tablet 4 mg  4 mg Oral Q6H PRN Laverle Hobby, PA-C   4 mg at 07/29/17 2128  . sulfaSALAzine (AZULFIDINE) EC tablet 500 mg  500 mg Oral BID Artist Beach, MD   500 mg at 07/30/17 0817  . traZODone (DESYREL) tablet 50 mg  50 mg Oral QHS,MR X 1 Laverle Hobby, PA-C   50 mg at 07/29/17 2101    Lab Results:  Results for orders placed or performed during the hospital encounter of 07/28/17 (from the past 48 hour(s))  Rapid urine drug screen (hospital performed)     Status: Abnormal   Collection Time: 07/28/17  8:24 PM  Result Value Ref Range   Opiates NONE DETECTED NONE DETECTED   Cocaine POSITIVE (A) NONE DETECTED   Benzodiazepines POSITIVE (A) NONE DETECTED   Amphetamines NONE DETECTED NONE DETECTED   Tetrahydrocannabinol POSITIVE (A) NONE DETECTED   Barbiturates NONE DETECTED NONE DETECTED    Comment: (NOTE) DRUG SCREEN FOR MEDICAL PURPOSES ONLY.  IF CONFIRMATION IS NEEDED FOR ANY PURPOSE, NOTIFY LAB WITHIN 5 DAYS. LOWEST DETECTABLE LIMITS FOR URINE DRUG SCREEN Drug Class                     Cutoff (ng/mL) Amphetamine and metabolites    1000 Barbiturate and metabolites    200 Benzodiazepine                 240 Tricyclics and metabolites     300 Opiates and metabolites        300 Cocaine and metabolites        300 THC                            50   Comprehensive metabolic panel     Status: Abnormal   Collection Time: 07/28/17  9:15 PM  Result Value Ref Range   Sodium 138 135 - 145 mmol/L   Potassium 3.9 3.5 - 5.1 mmol/L   Chloride  107 101 - 111 mmol/L   CO2 23 22 - 32 mmol/L   Glucose, Bld 87 65 - 99 mg/dL   BUN 9 6 - 20  mg/dL   Creatinine, Ser 0.73 0.44 - 1.00 mg/dL   Calcium 8.9 8.9 - 10.3 mg/dL   Total Protein 7.6 6.5 - 8.1 g/dL   Albumin 4.2 3.5 - 5.0 g/dL   AST 19 15 - 41 U/L   ALT 13 (L) 14 - 54 U/L   Alkaline Phosphatase 36 (L) 38 - 126 U/L   Total Bilirubin 0.3 0.3 - 1.2 mg/dL   GFR calc non Af Amer >60 >60 mL/min   GFR calc Af Amer >60 >60 mL/min    Comment: (NOTE) The eGFR has been calculated using the CKD EPI equation. This calculation has not been validated in all clinical situations. eGFR's persistently <60 mL/min signify possible Chronic Kidney Disease.    Anion gap 8 5 - 15  Ethanol     Status: None   Collection Time: 07/28/17  9:15 PM  Result Value Ref Range   Alcohol, Ethyl (B) <10 <10 mg/dL    Comment:        LOWEST DETECTABLE LIMIT FOR SERUM ALCOHOL IS 10 mg/dL FOR MEDICAL PURPOSES ONLY   Salicylate level     Status: None   Collection Time: 07/28/17  9:15 PM  Result Value Ref Range   Salicylate Lvl <6.2 2.8 - 30.0 mg/dL  Acetaminophen level     Status: Abnormal   Collection Time: 07/28/17  9:15 PM  Result Value Ref Range   Acetaminophen (Tylenol), Serum <10 (L) 10 - 30 ug/mL    Comment:        THERAPEUTIC CONCENTRATIONS VARY SIGNIFICANTLY. A RANGE OF 10-30 ug/mL MAY BE AN EFFECTIVE CONCENTRATION FOR MANY PATIENTS. HOWEVER, SOME ARE BEST TREATED AT CONCENTRATIONS OUTSIDE THIS RANGE. ACETAMINOPHEN CONCENTRATIONS >150 ug/mL AT 4 HOURS AFTER INGESTION AND >50 ug/mL AT 12 HOURS AFTER INGESTION ARE OFTEN ASSOCIATED WITH TOXIC REACTIONS.   cbc     Status: None   Collection Time: 07/28/17  9:15 PM  Result Value Ref Range   WBC 8.3 4.0 - 10.5 K/uL   RBC 4.71 3.87 - 5.11 MIL/uL   Hemoglobin 15.0 12.0 - 15.0 g/dL   HCT 44.1 36.0 - 46.0 %   MCV 93.6 78.0 - 100.0 fL   MCH 31.8 26.0 - 34.0 pg   MCHC 34.0 30.0 - 36.0 g/dL   RDW 13.5 11.5 - 15.5 %   Platelets 272 150 - 400  K/uL  I-Stat beta hCG blood, ED     Status: None   Collection Time: 07/28/17  9:36 PM  Result Value Ref Range   I-stat hCG, quantitative <5.0 <5 mIU/mL   Comment 3            Comment:   GEST. AGE      CONC.  (mIU/mL)   <=1 WEEK        5 - 50     2 WEEKS       50 - 500     3 WEEKS       100 - 10,000     4 WEEKS     1,000 - 30,000        FEMALE AND NON-PREGNANT FEMALE:     LESS THAN 5 mIU/mL     Blood Alcohol level:  Lab Results  Component Value Date   ETH <10 69/48/5462    Metabolic Disorder Labs: No results found for: HGBA1C, MPG No results found for: PROLACTIN Lab Results  Component Value Date   CHOL  10/01/2010    127  ATP III CLASSIFICATION:  <200     mg/dL   Desirable  200-239  mg/dL   Borderline High  >=240    mg/dL   High          TRIG 129 10/01/2010    Physical Findings: AIMS: Facial and Oral Movements Muscles of Facial Expression: None, normal Lips and Perioral Area: None, normal Jaw: None, normal Tongue: None, normal,Extremity Movements Upper (arms, wrists, hands, fingers): None, normal Lower (legs, knees, ankles, toes): None, normal, Trunk Movements Neck, shoulders, hips: None, normal, Overall Severity Severity of abnormal movements (highest score from questions above): None, normal Incapacitation due to abnormal movements: None, normal Patient's awareness of abnormal movements (rate only patient's report): No Awareness, Dental Status Current problems with teeth and/or dentures?: No Does patient usually wear dentures?: No  CIWA:  CIWA-Ar Total: 3 COWS:  COWS Total Score: 2  Musculoskeletal: Strength & Muscle Tone: within normal limits Gait & Station: normal Patient leans: N/A  Psychiatric Specialty Exam: Physical Exam  Nursing note and vitals reviewed. Constitutional: She is oriented to person, place, and time. She appears well-developed and well-nourished.  Cardiovascular: Normal rate.  Respiratory: Effort normal.  Musculoskeletal:  Normal range of motion.  Neurological: She is alert and oriented to person, place, and time.  Skin: Skin is warm.    Review of Systems  Constitutional: Negative.   HENT: Negative.   Eyes: Negative.   Respiratory: Negative.   Cardiovascular: Negative.   Gastrointestinal: Negative.   Genitourinary: Negative.   Musculoskeletal: Negative.   Skin: Negative.   Neurological: Negative.   Endo/Heme/Allergies: Negative.   Psychiatric/Behavioral: Negative.     Blood pressure 103/75, pulse 81, temperature 98.1 F (36.7 C), temperature source Oral, resp. rate 16, height 5' 5"  (1.651 m), weight 52.2 kg (115 lb), last menstrual period 07/13/2017.Body mass index is 19.14 kg/m.  General Appearance: Casual  Eye Contact:  Good  Speech:  Clear and Coherent and Normal Rate  Volume:  Normal  Mood:  Euthymic  Affect:  Congruent  Thought Process:  Goal Directed and Descriptions of Associations: Intact  Orientation:  Full (Time, Place, and Person)  Thought Content:  WDL  Suicidal Thoughts:  No  Homicidal Thoughts:  No  Memory:  Immediate;   Good Recent;   Good Remote;   Good  Judgement:  Good  Insight:  Good  Psychomotor Activity:  Normal  Concentration:  Concentration: Good and Attention Span: Good  Recall:  Good  Fund of Knowledge:  Good  Language:  Good  Akathisia:  No  Handed:  Right  AIMS (if indicated):     Assets:  Communication Skills Desire for Improvement Financial Resources/Insurance Housing Physical Health Social Support Transportation  ADL's:  Intact  Cognition:  WNL  Sleep:  Number of Hours: 6.75   Problems Addressed: Bipolar I SUD  Treatment Plan Summary: Daily contact with patient to assess and evaluate symptoms and progress in treatment, Medication management and Plan is to:  -Continue Latuda 120 mg PO Daily with supper for mood stability -Continue Trazodone 50 mg PO QHS PRN for insomnia -Continue Vistaril 25 mg PO Q6H PRN for anxiety -Encourage group therapy  participation  Lewis Shock, FNP 07/30/2017, 12:34 PM

## 2017-07-30 NOTE — [facility] Suicide Risk Assessment (Addendum)
Kimberly Caldwell INPATIENT:  Family/Significant Other Suicide Prevention Education  Suicide Prevention Education:  Education Completed; Kimberly Caldwell Kimberly Caldwell) [phone] has been identified by the patient as the family member/significant other with whom the patient will be residing, and identified as the person(s) who will aid the patient in the event of a mental health crisis (suicidal ideations/suicide attempt).  With written consent from the patient, the family member/significant other has been provided the following suicide prevention education, prior to the and/or following the discharge of the patient.  The suicide prevention education provided includes the following:  Suicide risk factors  Suicide prevention and interventions  National Suicide Hotline telephone number  Kimberly Caldwell assessment telephone number  Kimberly Caldwell Emergency Assistance Kimberly Caldwell and/or Kimberly Caldwell telephone number  Request made of family/significant other to:  Remove weapons (e.g., guns, rifles, knives), all items previously/currently identified as safety concern.    Remove drugs/medications (over-the-counter, prescriptions, illicit drugs), all items previously/currently identified as a safety concern.  The family member/significant other verbalizes understanding of the suicide prevention education information provided.  The family member/significant other agrees to remove the items of safety concern listed above.  Kimberly Caldwell stated that she is concerned with her granddaughters substance use and would like for her to receive treatment for it.  She states that Kimberly Caldwell has only shown passive SI in the past and that this was the first time she had mentioned wanting to harm herself.  Kimberly Caldwell would also like for Kimberly Caldwell's boyfriend to also seek help for substance use and fears for both of them.  She is looking to attend educational classes about substance use so that she can  help them both in the future.   Kimberly Caldwell Kimberly Caldwell 07/30/2017, 11:29 AM

## 2017-07-31 MED ORDER — NICOTINE 21 MG/24HR TD PT24: 21.0000 mg | MEDICATED_PATCH | Freq: Every day | TRANSDERMAL | 0 refills | Status: DC

## 2017-07-31 MED ORDER — HYDROXYZINE HCL 25 MG PO TABS: 25.0000 mg | ORAL_TABLET | Freq: Four times a day (QID) | ORAL | 0 refills | Status: DC | PRN

## 2017-07-31 MED ORDER — LURASIDONE HCL 60 MG PO TABS: 120.0000 mg | ORAL_TABLET | Freq: Every day | ORAL | 0 refills | Status: AC

## 2017-07-31 MED ORDER — TRAZODONE HCL 50 MG PO TABS: 50.0000 mg | ORAL_TABLET | Freq: Every evening | ORAL | 0 refills | Status: DC | PRN

## 2017-07-31 NOTE — [facility] Suicide Risk Assessment (Signed)
[facility] Discharge Suicide Risk Assessment   Principal Problem: Substance induced mood disorder [facility]) Discharge Diagnoses:  Patient Active Problem List   Diagnosis Date Noted  . Substance induced mood disorder ([place]) [F19.94] 07/29/2017  . Cannabis abuse [F12.10] 07/15/2011  . Bipolar I disorder ([place]) [F31.9]   . Generalized anxiety disorder [F41.1]   . Mood disorder ([place]) [F39] 06/17/2011    Total Time spent with patient: 45 minutes  Musculoskeletal: Strength & Muscle Tone: within normal limits Gait & Station: normal Patient leans: N/A  Psychiatric Specialty Exam: Review of Systems  Constitutional: Negative.   HENT: Negative.   Eyes: Negative.   Respiratory: Negative.   Cardiovascular: Negative.   Gastrointestinal: Negative.   Genitourinary: Negative.   Musculoskeletal: Negative.   Skin: Negative.   Neurological: Negative.   Endo/Heme/Allergies: Negative.   Psychiatric/Behavioral: Negative for depression, hallucinations, memory loss, substance abuse and suicidal ideas. The patient is not nervous/anxious and does not have insomnia.     Blood pressure 115/79, pulse (!) 107, temperature 98.6 F (37 C), temperature source Oral, resp. rate 16, height 5' 5"  (1.651 m), weight 52.2 kg (115 lb), last menstrual period 07/13/2017.Body mass index is 19.14 kg/m.  General Appearance: Neatly dressed, pleasant, engaging well and cooperative. Appropriate behavior. Not in any distress. Good relatedness. Not internally stimulated.  Eye Contact::  Good  Speech:  Spontaneous, normal prosody. Normal tone and rate.   Volume:  Normal  Mood:  Euthymic  Affect:  Appropriate and Full Range  Thought Process:  Linear  Orientation:  Full (Time, Place, and Person)  Thought Content:  No delusional theme. No preoccupation with violent thoughts. No negative ruminations. No obsession.  No hallucination in any modality.   Suicidal Thoughts:  No  Homicidal Thoughts:  No  Memory:  Immediate;   Good Recent;    Good Remote;   Good  Judgement:  Good  Insight:  Good  Psychomotor Activity:  Normal  Concentration:  Good  Recall:  Good  Fund of Knowledge:Good  Language: Good  Akathisia:  Negative  Handed:    AIMS (if indicated):     Assets:  Communication Skills Housing Intimacy Physical Health Resilience  Sleep:  Number of Hours: 6.5  Cognition: WNL  ADL's:  Intact   Clinical Assessment::   22 y.o Caucasian female, employed, lives with her boyfriend. Background history of SUD and mood disorder. Presented to the unit in company of her family. She was eventually committed on account of suicidal thoughts. Patient had expressed plans to OD on Seroquel.  Patient is intoxicated with multiple psychoactive substances. Routine labs are essentially normal. Negative HCG, Toxicology is negative,  UDS is positive for benzodiazepine, THC and cocaine. No alcohol.   Seen today. Patient has fully come off the effects of psychoactive substances. Says her mood has stabilized. She has been tolerating her mood stabilizer well.  Reports that she is in good spirits. Not feeling depressed. Reports normal energy and interest. She has been maintaining normal biological functions. She is able to think clearly. She is able to focus on task. Her thoughts are not crowded or racing. No evidence of mania. No hallucination in any modality. She is not making any delusional statement. No passivity of will/thought. She is fully in touch with reality. No thoughts of suicide. No thoughts of homicide. No violent thoughts. No overwhelming anxiety. No access to weapons. Patient reports that her family has been visiting. They are looking forward to her return today. I called her mother Kimberly Caldwell on [phone]  and left her a message.  Nursing staff reports that patient has been appropriate on the unit. Patient has been interacting well with peers. No behavioral issues. Patient has not voiced any suicidal thoughts. Patient has not been  observed to be internally stimulated. Patient has been adherent with treatment recommendations. Patient has been tolerating their medication well.   Patient was discussed at team. Team members feels that patient is back to her baseline level of function. Team agrees with plan to discharge patient today.    Demographic Factors:  NA  Loss Factors: NA  Historical Factors: Family history of mental illness or substance abuse and Impulsivity  Risk Reduction Factors:   Sense of responsibility to family, Employed, Living with another person, especially a relative, Positive social support, Positive therapeutic relationship and Positive coping skills or problem solving skills  Continued Clinical Symptoms:  As above   Cognitive Features That Contribute To Risk:  None    Suicide Risk:  Minimal. Patient is not having any thoughts of suicide at this time. Modifiable risk factors targeted during this admission includes mood disorder and substance use. Demographical and historical risk factors cannot be modified. Patient is now engaging well. Patient is reliable and is future oriented. We have buffered patient's support structures. At this point, patient is at low risk of suicide. Patient is aware of the effects of psychoactive substances on decision making process. Patient has been provided with emergency contacts. Patient acknowledges to use resources provided if unforseen circumstances changes their current risk stratification.    Follow-up Information    Group[facility] Follow up on 08/05/2017.   Specialty:  Behavioral Health Why:  Thursday at 3:30 with Dr Adelene Idler for your hospital follow up appoiontment Contact information: [address] [address] [address], [place] Follow up on 08/03/2017.   Specialty:  Behavioral Health Why:  Appointment for therapy/intake appointment at 12:00. Please arrive 10-15 minutes ahead of time for  paperwork.  Bring ID card and insurance.  If this time does not work for you, please call and reschedule time! Contact information: [facility] [address] [phone]           Plan Of Care/Follow-up recommendations:  1. Continue current psychotropic medications 2. Mental health and addiction follow up as arranged.  3. Discharge in care of her family 4. Provided limited quantity of prescriptions   Artist Beach, MD 07/31/2017, 10:36 AM

## 2017-07-31 NOTE — Progress Notes (Addendum)
Data. Patient denies SI/HI/AVH. Verbally contracts for safety on the unit and to come to staff before acting of any self harm thoughts/feelings. Patient interacting well with staff and other patients. Patient reports some anxiety this shift and reports that she has been feeling some anxiety after multiple peers left yesterday. Patient also reports that she likes her new room mate and that she is looking forward to leaving today, though she is a little nervous. Action. Emotional support and encouragement offered. Education provided on medication, indications and side effect. Q 15 minute checks done for safety. Response. Safety on the unit maintained through 15 minute checks.  Medications taken as prescribed. Attended groups. Remained calm and appropriate through out shift.

## 2017-07-31 NOTE — Progress Notes (Signed)
Discharge-  D- Patient verbalizes readiness for discharge: Denies SI/HI, is not psychotic or delusional. A- Discharge instructions read and discussed with patient.  All belongings returned to patient.  Patient given and completed patient survey. R- Patient cooperative with discharge process.  Patient verbalizes understanding of discharge instructions.  Signed for return of belongings. Escorted to the lobby and discharged as female who was identified as patient's boyfriend.

## 2017-07-31 NOTE — Discharge Summary (Signed)
Physician Discharge Summary Note  Patient:  Kimberly Caldwell is an 22 y.o., female MRN:  [id number] DOB:  [birth year] Patient phone:  [phone] (home)  Patient address:   [address] [place] [address],  Total Time spent with patient: 45 minutes  Date of Admission:  07/29/2017 Date of Discharge: 07/31/2017  Reason for Admission:   22 y.o Caucasian female, employed, lives with her boyfriend. Background history of SUD and mood disorder. Presented to the unit in company of her family. She was eventually committed on account of suicidal thoughts. Patient had expressed plans to OD on Seroquel.  Patient is intoxicated with multiple psychoactive substances. Routine labs are essentially normal. Negative HCG, Toxicology is negative,  UDS is positive for benzodiazepines. THC and cocaine. No alcohol.   At interview, patient reports increased use of cocaine in the past three weeks. Says her boyfriend also uses substances. Patient notes that use makes her more energetic, increases her speed of doing things. Says her thoughts has been racing. She has not been able to sleep at night. Says she has been very irritable. She has been dysphoric to a point she was having suicidal thoughts. She had thought of overdosing on her Seroquel. Says she called her girlfriend who alerted her boyfriend and family. Patient states that she freaked out when she got here and that was how she ended up IVC. Patient says she is now feeling better. She is willing to stay voluntarily. No hallucination in nay modality. No persecutory delusion. No grandiose or any other form of delusion. No passivity of will.  No overwhelming anxiety. No evidence of PTSD.  No thoughts of harming others. No thoughts of violence. No access to weapons.       Principal Problem: Substance induced mood disorder [facility]) Discharge Diagnoses: Patient Active Problem List   Diagnosis Date Noted  . Substance induced mood disorder ([place]) [F19.94]  07/29/2017  . Cannabis abuse [F12.10] 07/15/2011  . Bipolar I disorder ([place]) [F31.9]   . Generalized anxiety disorder [F41.1]   . Mood disorder ([place]) [F39] 06/17/2011    Past Psychiatric History: see H&P  Past Medical History:  Past Medical History:  Diagnosis Date  . Anxiety   . Bipolar disorder ([place])   . Colitis, ulcerative ([place]) 2012   ulceration of colon  . Migraine     Past Surgical History:  Procedure Laterality Date  . WISDOM TOOTH EXTRACTION  2007   Family History:  Family History  Problem Relation Age of Onset  . Alcohol abuse Paternal Grandfather    Family Psychiatric  History: see H&P Social History:  Social History   Substance and Sexual Activity  Alcohol Use Yes     Social History   Substance and Sexual Activity  Drug Use No    Social History   Socioeconomic History  . Marital status: Single    Spouse name: None  . Number of children: None  . Years of education: None  . Highest education level: None  Social Needs  . Financial resource strain: None  . Food insecurity - worry: None  . Food insecurity - inability: None  . Transportation needs - medical: None  . Transportation needs - non-medical: None  Occupational History  . Occupation: student    Comment: 12th grade home-schooled  Tobacco Use  . Smoking status: Current Every Day Smoker    Packs/day: 0.50    Years: 2.00    Pack years: 1.00  . Smokeless tobacco: Never Used  Substance and Sexual  Activity  . Alcohol use: Yes  . Drug use: No  . Sexual activity: Yes    Partners: Male    Birth control/protection: Pill  Other Topics Concern  . None  Social History Narrative  . None    Hospital Course:   Kimberly Caldwell was admitted for Substance induced mood disorder ([place]) , with crisis management.  Pt was treated discharged with the medications listed below under Medication List.  Medical problems were identified and treated as needed.  Home medications were restarted as  appropriate.  Improvement was monitored by observation and Kimberly Caldwell 's daily report of symptom reduction.  Emotional and mental status was monitored by daily self-inventory reports completed by Kimberly Caldwell and clinical staff.         Kimberly Caldwell was evaluated by the treatment team for stability and plans for continued recovery upon discharge. Kimberly Caldwell 's motivation was an integral factor for scheduling further treatment. Employment, transportation, bed availability, health status, family support, and any pending legal issues were also considered during hospital stay. Pt was offered further treatment options upon discharge including but not limited to Residential, Intensive Outpatient, and Outpatient treatment.  Kimberly Caldwell will follow up with the services as listed below under Follow Up Information.     Upon completion of this admission the patient was both mentally and medically stable for discharge denying suicidal/homicidal ideation, auditory/visual/tactile hallucinations, delusional thoughts and paranoia.    Kimberly Caldwell responded well to treatment with nicotine, lurisadone, trazodone without adverse effects. Pt demonstrated improvement without reported or observed adverse effects to the point of stability appropriate for outpatient management. Pertinent labs include: UDS+ benzo, cocaine, THC. Reviewed CBC, CMP, BAL, and UDS; all unremarkable aside from noted exceptions.    Physical Findings: AIMS: Facial and Oral Movements Muscles of Facial Expression: None, normal Lips and Perioral Area: None, normal Jaw: None, normal Tongue: None, normal,Extremity Movements Upper (arms, wrists, hands, fingers): None, normal Lower (legs, knees, ankles, toes): None, normal, Trunk Movements Neck, shoulders, hips: None, normal, Overall Severity Severity of abnormal movements (highest score from questions above): None, normal Incapacitation due to abnormal movements: None,  normal Patient's awareness of abnormal movements (rate only patient's report): No Awareness, Dental Status Current problems with teeth and/or dentures?: No Does patient usually wear dentures?: No  CIWA:  CIWA-Ar Total: 3 COWS:  COWS Total Score: 2  Musculoskeletal: Strength & Muscle Tone: within normal limits Gait & Station: normal Patient leans: N/A  Psychiatric Specialty Exam: Physical Exam  Review of Systems  Psychiatric/Behavioral: Positive for depression and substance abuse. Negative for hallucinations and suicidal ideas. The patient has insomnia. The patient is not nervous/anxious.   All other systems reviewed and are negative.   Blood pressure 115/79, pulse (!) 107, temperature 98.6 F (37 C), temperature source Oral, resp. rate 16, height 5' 5"  (1.651 m), weight 52.2 kg (115 lb), last menstrual period 07/13/2017.Body mass index is 19.14 kg/m.  SEE MD PSE WITHIN SRA    Have you used any form of tobacco in the last 30 days? (Cigarettes, Smokeless Tobacco, Cigars, and/or Pipes): Yes  Has this patient used any form of tobacco in the last 30 days? (Cigarettes, Smokeless Tobacco, Cigars, and/or Pipes) Yes, Yes, A prescription for an FDA-approved tobacco cessation medication was offered at discharge and the patient refused  Blood Alcohol level:  Lab Results  Component Value Date   ETH <10 76/19/5093    Metabolic Disorder Labs:  No results found for: HGBA1C, MPG No results found  for: PROLACTIN Lab Results  Component Value Date   CHOL  10/01/2010    127        ATP III CLASSIFICATION:  <200     mg/dL   Desirable  200-239  mg/dL   Borderline High  >=240    mg/dL   High          TRIG 129 10/01/2010    See Psychiatric Specialty Exam and Suicide Risk Assessment completed by Attending Physician prior to discharge.  Discharge destination:  Home  Is patient on multiple antipsychotic therapies at discharge:  No   Has Patient had three or more failed trials of antipsychotic  monotherapy by history:  No  Recommended Plan for Multiple Antipsychotic Therapies: NA   Allergies as of 07/31/2017      Reactions   Dilaudid [hydromorphone Hcl]    Violent nausea    Hydromorphone Nausea And Vomiting      Medication List    STOP taking these medications   clonazePAM 0.5 MG tablet Commonly known as:  KLONOPIN   fexofenadine 180 MG tablet Commonly known as:  ALLEGRA   folic acid 1 MG tablet Commonly known as:  FOLVITE   metroNIDAZOLE 500 MG tablet Commonly known as:  FLAGYL   predniSONE 20 MG tablet Commonly known as:  DELTASONE   traMADol 50 MG tablet Commonly known as:  ULTRAM     TAKE these medications     Indication  azaTHIOprine 50 MG tablet Commonly known as:  IMURAN Take 50 mg by mouth at bedtime.  Indication:  bowel inflammation   ENTYVIO 300 MG injection Generic drug:  vedolizumab Inject 300 mg into the vein. 300 mg infused every 8 weeks  Indication:  bowel problems   hydrOXYzine 25 MG tablet Commonly known as:  ATARAX/VISTARIL Take 1 tablet (25 mg total) by mouth every 6 (six) hours as needed for anxiety.  Indication:  Feeling Anxious   levonorgestrel-ethinyl estradiol tablet Commonly known as:  ENPRESSE,TRIVORA Take 1 tablet by mouth daily.  Indication:  contraceptives   Lurasidone HCl 60 MG Tabs Take 2 tablets (120 mg total) by mouth daily with supper. What changed:    medication strength  when to take this  Indication:  mood stabilization   nicotine 21 mg/24hr patch Commonly known as:  NICODERM CQ - dosed in mg/24 hours Place 1 patch (21 mg total) onto the skin daily. Start taking on:  08/01/2017  Indication:  Nicotine Addiction   ondansetron 8 MG disintegrating tablet Commonly known as:  ZOFRAN ODT Take 1 tablet (8 mg total) by mouth every 8 (eight) hours as needed for nausea or vomiting.  Indication:  Nausea and Vomiting   sulfaSALAzine 500 MG tablet Commonly known as:  AZULFIDINE Take 500 mg by mouth 4 (four)  times daily.  Indication:  bowel inflammation   traZODone 50 MG tablet Commonly known as:  DESYREL Take 1 tablet (50 mg total) by mouth at bedtime and may repeat dose one time if needed.  Indication:  Trouble Sleeping      Follow-up Information    Group, [facility] up on 08/05/2017.   Specialty:  Behavioral Health Why:  Thursday at 3:30 with Dr Adelene Idler for your hospital follow up appoiontment Contact information: [place] [address] [place], [place] Follow up on 08/03/2017.   Specialty:  Behavioral Health Why:  Appointment for therapy/intake appointment at 12:00. Please arrive 10-15 minutes ahead  of time for paperwork.  Bring ID card and insurance.  If this time does not work for you, please call and reschedule time! Contact information: [address] [place] [address] [phone]           Follow-up recommendations:  Activity:  As tolerated Diet:  Heart healthy with low sodium.  Comments:  Take all medications as prescribed. Keep all follow-up appointments as scheduled.  Do not consume alcohol or use illegal drugs while on prescription medications. Report any adverse effects from your medications to your primary care provider promptly.  In the event of recurrent symptoms or worsening symptoms, call 911, a crisis hotline, or go to the nearest emergency department for evaluation.    Signed: Benjamine Mola, FNP 07/31/2017, 10:52 AM

## 2017-07-31 NOTE — [facility] Group Notes (Addendum)
LCSW Group Therapy Note  07/31/2017     9:15-10:15AM  Type of Therapy and Topic:  Group Therapy:  Decisional Balance/Substance Use  Participation Level:  Active         Description of Group:  The main focus of today's process group was learning how to use a decisional balance exercise to make a decision about whether to change an unhealthy coping skill, as well as how to use the information gathered in the actual process of planning that change.  Patients listed some of their most frequently utilized unhealthy coping techniques and CSW pointed out the similarities.  Motivational Interviewing was utilized to help patients explore in-depth the perceived benefits and costs of unhealthy coping techniques (with an emphasis on drinking & drugging) as well as the benefits and costs of replacing that with other, healthy coping skills.  A handout was distributed for patients to be able to do this exercise for themselves.     Therapeutic Goals 1. Patient will be able to utilize the decision balance exercise on their own 2. Patient will list coping skills they use to fulfill their needs 3. Patient will identify the differences between healthy and  unhealthy coping skills 4. Patient will verbalize the costs and benefits of drinking/drugging versus making the choice to change 5. Patient will learn how about how using this exercise on an ongoing basis can help to make decisions about whether to make a change, and how everyone is at a different place in that decision-making process.  Summary of Patient Progress: During group, patient expressed that she has used drugs as a means of escaping her problems and stated she really wants to not do that any more.  She was very open to the group during the discussion and did not seem focused on her discharge, but was willing to look at her own problems and tendencies closely.   Therapeutic Modalities Cognitive Behavioral Therapy Motivational Interviewing  Selmer Dominion, LCSW 07/31/2017, 2:00 PM

## 2017-08-05 DIAGNOSIS — F3175 Bipolar disorder, in partial remission, most recent episode depressed: Secondary | ICD-10-CM | POA: Diagnosis not present

## 2017-08-05 DIAGNOSIS — K51011 Ulcerative (chronic) pancolitis with rectal bleeding: Secondary | ICD-10-CM | POA: Diagnosis not present

## 2017-09-30 DIAGNOSIS — K51011 Ulcerative (chronic) pancolitis with rectal bleeding: Secondary | ICD-10-CM | POA: Diagnosis not present

## 2017-10-25 DIAGNOSIS — R110 Nausea: Secondary | ICD-10-CM | POA: Diagnosis not present

## 2017-10-25 DIAGNOSIS — R61 Generalized hyperhidrosis: Secondary | ICD-10-CM | POA: Diagnosis not present

## 2017-10-25 DIAGNOSIS — K5190 Ulcerative colitis, unspecified, without complications: Secondary | ICD-10-CM | POA: Diagnosis not present

## 2017-12-10 DIAGNOSIS — F3175 Bipolar disorder, in partial remission, most recent episode depressed: Secondary | ICD-10-CM | POA: Diagnosis not present

## 2018-01-11 DIAGNOSIS — F3170 Bipolar disorder, currently in remission, most recent episode unspecified: Secondary | ICD-10-CM | POA: Diagnosis not present

## 2018-01-18 DIAGNOSIS — F3170 Bipolar disorder, currently in remission, most recent episode unspecified: Secondary | ICD-10-CM | POA: Diagnosis not present

## 2018-02-01 DIAGNOSIS — Z3041 Encounter for surveillance of contraceptive pills: Secondary | ICD-10-CM | POA: Diagnosis not present

## 2018-02-01 DIAGNOSIS — Z124 Encounter for screening for malignant neoplasm of cervix: Secondary | ICD-10-CM | POA: Diagnosis not present

## 2018-02-01 DIAGNOSIS — K5130 Ulcerative (chronic) rectosigmoiditis without complications: Secondary | ICD-10-CM | POA: Diagnosis not present

## 2018-02-08 DIAGNOSIS — K5100 Ulcerative (chronic) pancolitis without complications: Secondary | ICD-10-CM | POA: Diagnosis not present

## 2018-03-15 DIAGNOSIS — F3170 Bipolar disorder, currently in remission, most recent episode unspecified: Secondary | ICD-10-CM | POA: Diagnosis not present

## 2018-04-05 DIAGNOSIS — F3170 Bipolar disorder, currently in remission, most recent episode unspecified: Secondary | ICD-10-CM | POA: Diagnosis not present

## 2018-04-06 DIAGNOSIS — K5100 Ulcerative (chronic) pancolitis without complications: Secondary | ICD-10-CM | POA: Diagnosis not present

## 2018-04-06 DIAGNOSIS — F3170 Bipolar disorder, currently in remission, most recent episode unspecified: Secondary | ICD-10-CM | POA: Diagnosis not present

## 2018-04-06 DIAGNOSIS — Z79899 Other long term (current) drug therapy: Secondary | ICD-10-CM | POA: Diagnosis not present

## 2018-05-10 DIAGNOSIS — F3170 Bipolar disorder, currently in remission, most recent episode unspecified: Secondary | ICD-10-CM | POA: Diagnosis not present

## 2018-05-31 DIAGNOSIS — K5100 Ulcerative (chronic) pancolitis without complications: Secondary | ICD-10-CM | POA: Diagnosis not present

## 2018-05-31 DIAGNOSIS — K51011 Ulcerative (chronic) pancolitis with rectal bleeding: Secondary | ICD-10-CM | POA: Diagnosis not present

## 2018-06-19 ENCOUNTER — Other Ambulatory Visit: Payer: Self-pay

## 2018-06-19 ENCOUNTER — Encounter (HOSPITAL_BASED_OUTPATIENT_CLINIC_OR_DEPARTMENT_OTHER): Payer: Self-pay | Admitting: Emergency Medicine

## 2018-06-19 ENCOUNTER — Emergency Department (HOSPITAL_BASED_OUTPATIENT_CLINIC_OR_DEPARTMENT_OTHER): Payer: BLUE CROSS/BLUE SHIELD

## 2018-06-19 ENCOUNTER — Emergency Department (HOSPITAL_BASED_OUTPATIENT_CLINIC_OR_DEPARTMENT_OTHER)
Admission: EM | Admit: 2018-06-19 | Discharge: 2018-06-19 | Disposition: A | Discharge status: Home / Self Care | Payer: BLUE CROSS/BLUE SHIELD | Attending: Emergency Medicine | Admitting: Emergency Medicine

## 2018-06-19 DIAGNOSIS — R112 Nausea with vomiting, unspecified: Secondary | ICD-10-CM | POA: Diagnosis not present

## 2018-06-19 DIAGNOSIS — F17200 Nicotine dependence, unspecified, uncomplicated: Secondary | ICD-10-CM | POA: Diagnosis not present

## 2018-06-19 DIAGNOSIS — Z79899 Other long term (current) drug therapy: Secondary | ICD-10-CM | POA: Diagnosis not present

## 2018-06-19 DIAGNOSIS — R1030 Lower abdominal pain, unspecified: Secondary | ICD-10-CM | POA: Diagnosis not present

## 2018-06-19 DIAGNOSIS — R1031 Right lower quadrant pain: Secondary | ICD-10-CM | POA: Diagnosis present

## 2018-06-19 DIAGNOSIS — F1210 Cannabis abuse, uncomplicated: Secondary | ICD-10-CM | POA: Insufficient documentation

## 2018-06-19 DIAGNOSIS — F319 Bipolar disorder, unspecified: Secondary | ICD-10-CM | POA: Insufficient documentation

## 2018-06-19 DIAGNOSIS — K5180 Other ulcerative colitis without complications: Secondary | ICD-10-CM | POA: Insufficient documentation

## 2018-06-19 DIAGNOSIS — R1110 Vomiting, unspecified: Secondary | ICD-10-CM | POA: Diagnosis not present

## 2018-06-19 DIAGNOSIS — F419 Anxiety disorder, unspecified: Secondary | ICD-10-CM | POA: Diagnosis not present

## 2018-06-19 LAB — URINALYSIS, MICROSCOPIC (REFLEX)

## 2018-06-19 LAB — CBC
HCT: 43.3 % (ref 36.0–46.0)
Hemoglobin: 13.9 g/dL (ref 12.0–15.0)
MCH: 31.4 pg (ref 26.0–34.0)
MCHC: 32.1 g/dL (ref 30.0–36.0)
MCV: 98 fL (ref 80.0–100.0)
Platelets: 258 10*3/uL (ref 150–400)
RBC: 4.42 MIL/uL (ref 3.87–5.11)
RDW: 13.4 % (ref 11.5–15.5)
WBC: 13.6 10*3/uL — ABNORMAL HIGH (ref 4.0–10.5)
nRBC: 0 % (ref 0.0–0.2)

## 2018-06-19 LAB — COMPREHENSIVE METABOLIC PANEL
ALT: 16 U/L (ref 0–44)
AST: 23 U/L (ref 15–41)
Albumin: 4.1 g/dL (ref 3.5–5.0)
Alkaline Phosphatase: 25 U/L — ABNORMAL LOW (ref 38–126)
Anion gap: 7 (ref 5–15)
BUN: 10 mg/dL (ref 6–20)
CO2: 26 mmol/L (ref 22–32)
Calcium: 8.8 mg/dL — ABNORMAL LOW (ref 8.9–10.3)
Chloride: 108 mmol/L (ref 98–111)
Creatinine, Ser: 0.76 mg/dL (ref 0.44–1.00)
GFR calc Af Amer: >60 mL/min (ref >60)
GFR calc non Af Amer: >60 mL/min (ref >60)
Glucose, Bld: 132 mg/dL — ABNORMAL HIGH (ref 70–99)
Potassium: 3.5 mmol/L (ref 3.5–5.1)
Sodium: 141 mmol/L (ref 135–145)
Total Bilirubin: 0.4 mg/dL (ref 0.3–1.2)
Total Protein: 7.1 g/dL (ref 6.5–8.1)

## 2018-06-19 LAB — URINALYSIS, ROUTINE W REFLEX MICROSCOPIC
Bilirubin Urine: NEGATIVE
Glucose, UA: NEGATIVE mg/dL
Ketones, ur: NEGATIVE mg/dL
LEUKOCYTES UA: NEGATIVE
NITRITE: NEGATIVE
PROTEIN: NEGATIVE mg/dL
Specific Gravity, Urine: 1.015 (ref 1.005–1.030)
pH: 8.5 — ABNORMAL HIGH (ref 5.0–8.0)

## 2018-06-19 LAB — PREGNANCY, URINE: Preg Test, Ur: NEGATIVE

## 2018-06-19 LAB — LIPASE, BLOOD: Lipase: 43 U/L (ref 11–51)

## 2018-06-19 MED ORDER — OXYCODONE-ACETAMINOPHEN 5-325 MG PO TABS: 1.0000 | ORAL_TABLET | ORAL | 0 refills | Status: DC | PRN

## 2018-06-19 MED ORDER — PREDNISONE 10 MG PO TABS: ORAL_TABLET | ORAL | 0 refills | Status: DC

## 2018-06-19 MED ORDER — ONDANSETRON HCL 4 MG/2ML IJ SOLN
4.0000 mg | Freq: Once | INTRAMUSCULAR | Status: AC
  Administered 2018-06-19: 4 mg via INTRAVENOUS
  Filled 2018-06-19: qty 2

## 2018-06-19 MED ORDER — MORPHINE SULFATE (PF) 4 MG/ML IV SOLN
4.0000 mg | Freq: Once | INTRAVENOUS | Status: AC
  Administered 2018-06-19: 4 mg via INTRAVENOUS
  Filled 2018-06-19: qty 1

## 2018-06-19 MED ORDER — SODIUM CHLORIDE 0.9 % IV BOLUS
1000.0000 mL | Freq: Once | INTRAVENOUS | Status: AC
  Administered 2018-06-19: 1000 mL via INTRAVENOUS

## 2018-06-19 MED ORDER — PROMETHAZINE HCL 25 MG PO TABS: 25.0000 mg | ORAL_TABLET | Freq: Four times a day (QID) | ORAL | 0 refills | Status: DC | PRN

## 2018-06-19 MED ORDER — PROMETHAZINE HCL 25 MG/ML IJ SOLN
25.0000 mg | Freq: Once | INTRAMUSCULAR | Status: AC
  Administered 2018-06-19: 25 mg via INTRAVENOUS
  Filled 2018-06-19: qty 1

## 2018-06-19 MED ORDER — IOPAMIDOL (ISOVUE-300) INJECTION 61%
100.0000 mL | Freq: Once | INTRAVENOUS | Status: AC | PRN
  Administered 2018-06-19: 100 mL via INTRAVENOUS

## 2018-06-19 MED ORDER — OXYCODONE-ACETAMINOPHEN 5-325 MG PO TABS
1.0000 | ORAL_TABLET | Freq: Once | ORAL | Status: AC
  Administered 2018-06-19: 1 via ORAL
  Filled 2018-06-19: qty 1

## 2018-06-19 MED ORDER — PROMETHAZINE HCL 25 MG PO TABS
25.0000 mg | ORAL_TABLET | Freq: Once | ORAL | Status: AC
  Administered 2018-06-19: 25 mg via ORAL
  Filled 2018-06-19: qty 1

## 2018-06-19 MED ORDER — IOPAMIDOL (ISOVUE-300) INJECTION 61%: 30.0000 mL | Freq: Once | INTRAVENOUS | Status: DC | PRN

## 2018-06-19 NOTE — ED Provider Notes (Signed)
[facility] EMERGENCY DEPARTMENT Provider Note   CSN: [id number] Arrival date & time: 06/19/18  5277     History   Chief Complaint Chief Complaint  Patient presents with  . Abdominal Pain    HPI Juliyah Mergen is a 22 y.o. female.  The history is provided by the patient, a friend and medical records. No language interpreter was used.  Abdominal Pain   This is a recurrent problem. The current episode started yesterday. The problem occurs constantly. The problem has been gradually worsening. The pain is associated with an unknown factor. The pain is located in the RLQ and suprapubic region. The quality of the pain is aching, sharp and throbbing. The pain is at a severity of 8/10. The pain is severe. Associated symptoms include nausea and vomiting. Pertinent negatives include fever, diarrhea, flatus, melena, constipation, dysuria, frequency and headaches. The symptoms are aggravated by palpation. Nothing relieves the symptoms. Her past medical history is significant for ulcerative colitis.    Past Medical History:  Diagnosis Date  . Anxiety   . Bipolar disorder ([place])   . Colitis, ulcerative ([place]) 2012   ulceration of colon  . Migraine     Patient Active Problem List   Diagnosis Date Noted  . Substance induced mood disorder ([place]) 07/29/2017  . Cannabis abuse 07/15/2011  . Bipolar I disorder ([place])   . Generalized anxiety disorder   . Mood disorder ([place]) 06/17/2011    Past Surgical History:  Procedure Laterality Date  . WISDOM TOOTH EXTRACTION  2007     OB History    Gravida  0   Para  0   Term  0   Preterm  0   AB  0   Living  0     SAB  0   TAB  0   Ectopic  0   Multiple  0   Live Births  0            Home Medications    Prior to Admission medications   Medication Sig Start Date End Date Taking? Authorizing Provider  azaTHIOprine (IMURAN) 50 MG tablet Take 50 mg by mouth at bedtime.     [provider]  hydrOXYzine  (ATARAX/VISTARIL) 25 MG tablet Take 1 tablet (25 mg total) by mouth every 6 (six) hours as needed for anxiety. 07/31/17   Withrow, Elyse Jarvis, FNP  levonorgestrel-ethinyl estradiol (ENPRESSE,TRIVORA) tablet Take 1 tablet by mouth daily.    [provider]  Lurasidone HCl 60 MG TABS Take 2 tablets (120 mg total) by mouth daily with supper. 07/31/17   Withrow, Elyse Jarvis, FNP  nicotine (NICODERM CQ - DOSED IN MG/24 HOURS) 21 mg/24hr patch Place 1 patch (21 mg total) onto the skin daily. 08/01/17   Withrow, Elyse Jarvis, FNP  ondansetron (ZOFRAN ODT) 8 MG disintegrating tablet Take 1 tablet (8 mg total) by mouth every 8 (eight) hours as needed for nausea or vomiting. 07/02/16   Molpus, John, MD  sulfaSALAzine (AZULFIDINE) 500 MG tablet Take 500 mg by mouth 4 (four) times daily.    [provider]  traZODone (DESYREL) 50 MG tablet Take 1 tablet (50 mg total) by mouth at bedtime and may repeat dose one time if needed. 07/31/17   Withrow, Elyse Jarvis, FNP  vedolizumab (ENTYVIO) 300 MG injection Inject 300 mg into the vein. 300 mg infused every 8 weeks 05/18/16   [provider]    Family History Family History  Problem Relation Age of Onset  .  Alcohol abuse Paternal Grandfather     Social History Social History   Tobacco Use  . Smoking status: Current Every Day Smoker    Packs/day: 0.50    Years: 2.00    Pack years: 1.00  . Smokeless tobacco: Never Used  Substance Use Topics  . Alcohol use: Yes  . Drug use: No     Allergies   Dilaudid [hydromorphone hcl] and Hydromorphone   Review of Systems Review of Systems  Constitutional: Negative for chills, diaphoresis, fatigue and fever.  HENT: Negative for congestion.   Eyes: Negative for visual disturbance.  Respiratory: Negative for cough, chest tightness, shortness of breath and wheezing.   Cardiovascular: Negative for chest pain and palpitations.  Gastrointestinal: Positive for abdominal pain, nausea and vomiting. Negative for  abdominal distention, constipation, diarrhea, flatus and melena.  Genitourinary: Negative for dysuria, flank pain and frequency.  Musculoskeletal: Negative for back pain, neck pain and neck stiffness.  Skin: Negative for rash and wound.  Neurological: Negative for light-headedness and headaches.  Psychiatric/Behavioral: Negative for agitation.  All other systems reviewed and are negative.    Physical Exam Updated Vital Signs BP 119/82 (BP Location: Left Arm)   Pulse 95   Temp 98.6 F (37 C) (Oral)   Resp 18   Ht 5' 5"  (1.651 m)   Wt 52.2 kg   LMP 06/12/2018   SpO2 95%   BMI 19.14 kg/m   Physical Exam  Constitutional: She appears well-developed and well-nourished.  Non-toxic appearance. She does not appear ill.  HENT:  Head: Normocephalic and atraumatic.  Nose: Nose normal.  Mouth/Throat: Oropharynx is clear and moist.  Eyes: Pupils are equal, round, and reactive to light. Conjunctivae and EOM are normal.  Neck: Normal range of motion. Neck supple.  Cardiovascular: Normal rate and regular rhythm.  No murmur heard. Pulmonary/Chest: Effort normal and breath sounds normal. No respiratory distress. She has no wheezes. She has no rales. She exhibits no tenderness.  Abdominal: Soft. Normal appearance. There is tenderness in the right lower quadrant and suprapubic area. There is no rigidity, no rebound and no CVA tenderness.  Musculoskeletal: She exhibits no edema or tenderness.  Neurological: She is alert. No sensory deficit.  Skin: Skin is warm and dry. Capillary refill takes less than 2 seconds. No rash noted. She is not diaphoretic. No erythema.  Psychiatric: She has a normal mood and affect.  Nursing note and vitals reviewed.    ED Treatments / Results  Labs (all labs ordered are listed, but only abnormal results are displayed) Labs Reviewed  COMPREHENSIVE METABOLIC PANEL - Abnormal; Notable for the following components:      Result Value   Glucose, Bld 132 (*)     Calcium 8.8 (*)    Alkaline Phosphatase 25 (*)    All other components within normal limits  CBC - Abnormal; Notable for the following components:   WBC 13.6 (*)    All other components within normal limits  URINALYSIS, ROUTINE W REFLEX MICROSCOPIC - Abnormal; Notable for the following components:   APPearance HAZY (*)    pH 8.5 (*)    Hgb urine dipstick TRACE (*)    All other components within normal limits  URINALYSIS, MICROSCOPIC (REFLEX) - Abnormal; Notable for the following components:   Bacteria, UA MANY (*)    All other components within normal limits  URINE CULTURE  LIPASE, BLOOD  PREGNANCY, URINE    EKG None  Radiology Ct Abdomen Pelvis W Contrast  Result Date: 06/19/2018  CLINICAL DATA:  Nausea and vomiting since last evening. History of ulcerative colitis. EXAM: CT ABDOMEN AND PELVIS WITH CONTRAST TECHNIQUE: Multidetector CT imaging of the abdomen and pelvis was performed using the standard protocol following bolus administration of intravenous contrast. CONTRAST:  169m ISOVUE-300 IOPAMIDOL (ISOVUE-300) INJECTION 61% COMPARISON:  03/27/2016 FINDINGS: Lower chest: The lung bases are clear of acute process. No pleural effusion or pulmonary lesions. The heart is normal in size. No pericardial effusion. The distal esophagus and aorta are unremarkable. Hepatobiliary: No focal hepatic lesions or intrahepatic biliary dilatation. The gallbladder is normal. No common bile duct dilatation. Pancreas: No mass, inflammation or ductal dilatation. Spleen: Normal size.  No focal lesions. Adrenals/Urinary Tract: The adrenal glands and kidneys are unremarkable. No renal, ureteral or bladder calculi or mass. Stomach/Bowel: The stomach, duodenum and small bowel are unremarkable. No acute inflammatory changes, mass lesions or obstructive findings. The terminal ileum and appendix are normal. Moderate areas of colonic inflammation with submucosal edema and moderate mucosal enhancement. Moderate  thumbprinting type changes involving the transverse colon. Findings suggest mild active ulcerative colitis. There are also areas of fibrofatty infiltrative changes suggesting chronic inflammatory bowel disease. No obstructive findings or mass. Vascular/Lymphatic: The aorta is normal in caliber. No dissection. The branch vessels are patent. The major venous structures are patent. No mesenteric or retroperitoneal mass or adenopathy. Small scattered lymph nodes are noted. Reproductive: Retroverted uterus with prominent endometrium, likely due to secretory phase of ovulation. The ovaries are normal. Other: No pelvic mass or adenopathy. No free pelvic fluid collections. No inguinal mass or adenopathy. No abdominal wall hernia or subcutaneous lesions. Musculoskeletal: No significant bony findings. IMPRESSION: 1. CT findings consistent with areas of active ulcerative colitis with submucosal edema and mucosal enhancement. 2. No findings for small bowel disease. The terminal ileum and appendix are normal. 3. No abdominal/pelvic mass lesions or lymphadenopathy. Electronically Signed   By: PMarijo SanesM.D.   On: 06/19/2018 11:31    Procedures Procedures (including critical care time)  Medications Ordered in ED Medications  iopamidol (ISOVUE-300) 61 % injection 30 mL (has no administration in time range)  sodium chloride 0.9 % bolus 1,000 mL (0 mLs Intravenous Stopped 06/19/18 1106)  morphine 4 MG/ML injection 4 mg (4 mg Intravenous Given 06/19/18 0829)  ondansetron (ZOFRAN) injection 4 mg (4 mg Intravenous Given 06/19/18 0829)  iopamidol (ISOVUE-300) 61 % injection 100 mL (100 mLs Intravenous Contrast Given 06/19/18 1045)  promethazine (PHENERGAN) injection 25 mg (25 mg Intravenous Given 06/19/18 0934)  morphine 4 MG/ML injection 4 mg (4 mg Intravenous Given 06/19/18 0934)  oxyCODONE-acetaminophen (PERCOCET/ROXICET) 5-325 MG per tablet 1 tablet (1 tablet Oral Given 06/19/18 1257)  promethazine (PHENERGAN) tablet 25  mg (25 mg Oral Given 06/19/18 1257)     Initial Impression / Assessment and Plan / ED Course  I have reviewed the triage vital signs and the nursing notes.  Pertinent labs & imaging results that were available during my care of the patient were reviewed by me and considered in my medical decision making (see chart for details).     AAryaaFMcdiarmidis a 22y.o. female with a past medical history significant for ulcerative colitis and migraines who presents with abdominal pain, nausea and vomiting.  Patient reports it is been over 1 years and she is had a colitis flare and typically does very well.  She reports that starting last night she had severe lower abdominal pain with associated nausea and vomiting.  She reports has had some  blood in her emesis and has vomited approximately 20+ times.  She denies any constipation or diarrhea or blood in her bowel movement.  She is still passing gas.  She reports her abdominal pain is up to a 7-1/2 out of 10 and she typically has a less than 3 out of 10 chronic abdominal pain.  She reports no history of abdominal surgeries in the past.  She reports no significant fevers, chills, chest pain, shortness breath, or cough.  She reports mild congestion recently.  She denies any urinary symptoms or vaginal symptoms.  She does not think she is pregnant.  She denies any recent trauma.  On exam, patient's lower abdomen is tender to palpation in the right lower quadrant and suprapubic area more than the left.  She has no flank tenderness or back tenderness.  Lungs are clear and chest is nontender.  Patient resting comfortably but is having nausea and vomiting.  Patient given pain medicine, nausea medicine, and fluids.  She reports morphine worse when she is allergic to Dilaudid.  She will have screening laboratory testing with labs and urinalysis.  Due to concern for ulcerative colitis flare and the severity of pain, CT imaging will be obtained to rule out ulcerative colitis  versus appendicitis versus other intra-abdominal pathology requiring intervention.  Anticipate reassessment after work-up.  Laboratory testing showed mild leukocytosis consistent with inflammatory disease.  Lipase not elevated.  No evidence of urinary tract infection.  Patient is nonpregnant.  CT scan shows evidence of ulcerative colitis flare.  No evidence of abscess, obstruction, or appendicitis.  Patient feeling better after medications.  Had a discussion with patient as to previous management strategies and she has had success with a long steroid taper as well as other symptomatic management medications.  She was given prescription for Percocet and Phenergan which is helped her today and will also be given a taper of prednisone.  Patient will follow-up with her PCP for further management.  Patient was able to eat and drink after medications and was feeling better.  Patient agreed with strict return precautions and had no other questions or concerns.  Patient was discharged in good condition.   Final Clinical Impressions(s) / ED Diagnoses   Final diagnoses:  Other ulcerative colitis without complication (HCC)  Lower abdominal pain  Non-intractable vomiting with nausea, unspecified vomiting type    ED Discharge Orders         Ordered    oxyCODONE-acetaminophen (PERCOCET/ROXICET) 5-325 MG tablet  Every 4 hours PRN     06/19/18 1341    promethazine (PHENERGAN) 25 MG tablet  Every 6 hours PRN     06/19/18 1341    predniSONE (DELTASONE) 10 MG tablet     06/19/18 1341         Clinical Impression: 1. Other ulcerative colitis without complication ([place])   2. Lower abdominal pain   3. Non-intractable vomiting with nausea, unspecified vomiting type     Disposition: Discharge  Condition: Good  I have discussed the results, Dx and Tx plan with the pt(& family if present). He/she/they expressed understanding and agree(s) with the plan. Discharge instructions discussed at great length.  Strict return precautions discussed and pt &/or family have verbalized understanding of the instructions. No further questions at time of discharge.    Discharge Medication List as of 06/19/2018  1:43 PM    START taking these medications   Details  oxyCODONE-acetaminophen (PERCOCET/ROXICET) 5-325 MG tablet Take 1 tablet by mouth every 4 (four) hours  as needed for severe pain., Starting Sun 06/19/2018, Print    predniSONE (DELTASONE) 10 MG tablet Multiple Dosages:Starting Sun 06/19/2018, Last dose on Sat 07/02/2018, THEN Starting Sun 07/03/2018, Last dose on Sat 07/09/2018, THEN Starting Sun 07/10/2018, Last dose on Sat 07/16/2018, THEN Starting Sun 07/17/2018, Last dose on Sat 07/23/2018, THEN St arting Sun 07/24/2018, Last dose on Sat 07/30/2018, THEN Starting Sun 07/31/2018, Last dose on Sat 08/06/2018, THEN Starting Sun 08/07/2018, Last dose on Sat 08/13/2018, THEN Starting Sun 08/14/2018, Last dose on Sat 2/8/2020Take 4 tablets (40 mg total) by mo uth daily for 14 days, THEN 3.5 tablets (35 mg total) daily for 7 days, THEN 3 tablets (30 mg total) daily for 7 days, THEN 2.5 tablets (25 mg total) daily for 7 days, THEN 2 tablets (20 mg total) daily for 7 days, THEN 1.5 tablets (15 mg total) daily fo r 7 days, THEN 1 tablet (10 mg total) daily for 7 days, THEN 0.5 tablets (5 mg total) daily for 7 days., Print    promethazine (PHENERGAN) 25 MG tablet Take 1 tablet (25 mg total) by mouth every 6 (six) hours as needed for nausea or vomiting., Starting Sun 06/19/2018, Print        Follow Up: Briscoe Deutscher, MD [address] [place] [address] [phone]     [place] [address] [id number] [place] [address] [phone]       Rebie Peale, Gwenyth Allegra, MD 06/19/18 [phone]

## 2018-06-19 NOTE — ED Notes (Signed)
Oral contrast given @0855 ; scan time between 1030 and 1100

## 2018-06-19 NOTE — Discharge Instructions (Signed)
Your work-up, exam, and imaging are consistent with ulcerative colitis flare.  Please start the steroid taper as we discussed.  Please take the 40 mg daily for the next 2 weeks and then decrease each week by 5 mg.  Please follow-up with your primary doctor.  Please use the pain and nausea medicine to help maintain hydration and help with discomfort.  If any symptoms change or worsen, return to the nearest emergency department.

## 2018-06-19 NOTE — ED Triage Notes (Signed)
Pt c/o low abd pain with vomiting since last night. Reports a hx of ulcerative colitis. Denies bleeding. Also c/o headache after forceful vomiting.

## 2018-06-20 LAB — URINE CULTURE: Culture: NO GROWTH

## 2018-06-26 DIAGNOSIS — R5383 Other fatigue: Secondary | ICD-10-CM | POA: Diagnosis not present

## 2018-06-26 DIAGNOSIS — K5190 Ulcerative colitis, unspecified, without complications: Secondary | ICD-10-CM | POA: Diagnosis not present

## 2018-06-26 DIAGNOSIS — F314 Bipolar disorder, current episode depressed, severe, without psychotic features: Secondary | ICD-10-CM | POA: Diagnosis not present

## 2018-06-26 DIAGNOSIS — F329 Major depressive disorder, single episode, unspecified: Secondary | ICD-10-CM | POA: Diagnosis not present

## 2018-06-26 DIAGNOSIS — R45851 Suicidal ideations: Secondary | ICD-10-CM | POA: Diagnosis not present

## 2018-06-27 DIAGNOSIS — F314 Bipolar disorder, current episode depressed, severe, without psychotic features: Secondary | ICD-10-CM | POA: Diagnosis not present

## 2018-06-27 DIAGNOSIS — F329 Major depressive disorder, single episode, unspecified: Secondary | ICD-10-CM | POA: Diagnosis not present

## 2018-06-27 DIAGNOSIS — R5383 Other fatigue: Secondary | ICD-10-CM | POA: Diagnosis not present

## 2018-06-27 DIAGNOSIS — R45851 Suicidal ideations: Secondary | ICD-10-CM | POA: Diagnosis not present

## 2018-07-02 DIAGNOSIS — F3170 Bipolar disorder, currently in remission, most recent episode unspecified: Secondary | ICD-10-CM | POA: Diagnosis not present

## 2018-07-26 DIAGNOSIS — K5100 Ulcerative (chronic) pancolitis without complications: Secondary | ICD-10-CM | POA: Diagnosis not present

## 2018-08-02 DIAGNOSIS — F3170 Bipolar disorder, currently in remission, most recent episode unspecified: Secondary | ICD-10-CM | POA: Diagnosis not present

## 2018-08-08 ENCOUNTER — Emergency Department (HOSPITAL_COMMUNITY): Payer: BLUE CROSS/BLUE SHIELD

## 2018-08-08 ENCOUNTER — Other Ambulatory Visit: Payer: Self-pay

## 2018-08-08 ENCOUNTER — Encounter (HOSPITAL_COMMUNITY): Payer: Self-pay

## 2018-08-08 ENCOUNTER — Emergency Department (HOSPITAL_COMMUNITY)
Admission: EM | Admit: 2018-08-08 | Discharge: 2018-08-08 | Disposition: A | Discharge status: Home / Self Care | Payer: BLUE CROSS/BLUE SHIELD | Attending: Emergency Medicine | Admitting: Emergency Medicine

## 2018-08-08 DIAGNOSIS — Z79899 Other long term (current) drug therapy: Secondary | ICD-10-CM | POA: Insufficient documentation

## 2018-08-08 DIAGNOSIS — S161XXA Strain of muscle, fascia and tendon at neck level, initial encounter: Secondary | ICD-10-CM

## 2018-08-08 DIAGNOSIS — R0781 Pleurodynia: Secondary | ICD-10-CM | POA: Diagnosis not present

## 2018-08-08 DIAGNOSIS — Z3202 Encounter for pregnancy test, result negative: Secondary | ICD-10-CM | POA: Insufficient documentation

## 2018-08-08 DIAGNOSIS — Y999 Unspecified external cause status: Secondary | ICD-10-CM | POA: Insufficient documentation

## 2018-08-08 DIAGNOSIS — Y9389 Activity, other specified: Secondary | ICD-10-CM | POA: Diagnosis not present

## 2018-08-08 DIAGNOSIS — F17210 Nicotine dependence, cigarettes, uncomplicated: Secondary | ICD-10-CM | POA: Insufficient documentation

## 2018-08-08 DIAGNOSIS — Y92410 Unspecified street and highway as the place of occurrence of the external cause: Secondary | ICD-10-CM | POA: Insufficient documentation

## 2018-08-08 DIAGNOSIS — S299XXA Unspecified injury of thorax, initial encounter: Secondary | ICD-10-CM | POA: Diagnosis not present

## 2018-08-08 DIAGNOSIS — R52 Pain, unspecified: Secondary | ICD-10-CM | POA: Diagnosis not present

## 2018-08-08 DIAGNOSIS — R0789 Other chest pain: Secondary | ICD-10-CM

## 2018-08-08 DIAGNOSIS — S199XXA Unspecified injury of neck, initial encounter: Secondary | ICD-10-CM | POA: Diagnosis not present

## 2018-08-08 DIAGNOSIS — M542 Cervicalgia: Secondary | ICD-10-CM | POA: Diagnosis not present

## 2018-08-08 LAB — PREGNANCY, URINE: Preg Test, Ur: NEGATIVE

## 2018-08-08 MED ORDER — METHOCARBAMOL 500 MG PO TABS: 500.0000 mg | ORAL_TABLET | Freq: Two times a day (BID) | ORAL | 0 refills | Status: AC

## 2018-08-08 NOTE — Discharge Instructions (Addendum)
Warm compresses to sore muscles for 30 minutes at a time. Take Robaxin as prescribed as needed for muscle spasm. Take ibuprofen or Tylenol as needed as directed. Follow-up with your primary care provider.
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
## 2018-10-01 DIAGNOSIS — F3170 Bipolar disorder, currently in remission, most recent episode unspecified: Secondary | ICD-10-CM | POA: Diagnosis not present

## 2018-10-06 DIAGNOSIS — K5100 Ulcerative (chronic) pancolitis without complications: Secondary | ICD-10-CM | POA: Diagnosis not present

## 2018-10-08 ENCOUNTER — Encounter (HOSPITAL_BASED_OUTPATIENT_CLINIC_OR_DEPARTMENT_OTHER): Payer: Self-pay | Admitting: Emergency Medicine

## 2018-10-08 ENCOUNTER — Emergency Department (HOSPITAL_BASED_OUTPATIENT_CLINIC_OR_DEPARTMENT_OTHER): Payer: BLUE CROSS/BLUE SHIELD

## 2018-10-08 ENCOUNTER — Emergency Department (HOSPITAL_BASED_OUTPATIENT_CLINIC_OR_DEPARTMENT_OTHER)
Admission: EM | Admit: 2018-10-08 | Discharge: 2018-10-08 | Disposition: A | Discharge status: Home / Self Care | Payer: BLUE CROSS/BLUE SHIELD | Attending: Emergency Medicine | Admitting: Emergency Medicine

## 2018-10-08 ENCOUNTER — Other Ambulatory Visit: Payer: Self-pay

## 2018-10-08 DIAGNOSIS — R1030 Lower abdominal pain, unspecified: Secondary | ICD-10-CM

## 2018-10-08 DIAGNOSIS — F17210 Nicotine dependence, cigarettes, uncomplicated: Secondary | ICD-10-CM | POA: Diagnosis not present

## 2018-10-08 DIAGNOSIS — Z79899 Other long term (current) drug therapy: Secondary | ICD-10-CM | POA: Insufficient documentation

## 2018-10-08 DIAGNOSIS — Z3202 Encounter for pregnancy test, result negative: Secondary | ICD-10-CM | POA: Diagnosis not present

## 2018-10-08 DIAGNOSIS — R112 Nausea with vomiting, unspecified: Secondary | ICD-10-CM | POA: Diagnosis not present

## 2018-10-08 LAB — CBC WITH DIFFERENTIAL/PLATELET
Abs Immature Granulocytes: 0.02 10*3/uL (ref 0.00–0.07)
Basophils Absolute: 0 10*3/uL (ref 0.0–0.1)
Basophils Relative: 0 %
Eosinophils Absolute: 0 10*3/uL (ref 0.0–0.5)
Eosinophils Relative: 0 %
HCT: 45.4 % (ref 36.0–46.0)
Hemoglobin: 14.9 g/dL (ref 12.0–15.0)
Immature Granulocytes: 0 %
Lymphocytes Relative: 29 %
Lymphs Abs: 3 10*3/uL (ref 0.7–4.0)
MCH: 30.6 pg (ref 26.0–34.0)
MCHC: 32.8 g/dL (ref 30.0–36.0)
MCV: 93.2 fL (ref 80.0–100.0)
Monocytes Absolute: 0.7 10*3/uL (ref 0.1–1.0)
Monocytes Relative: 6 %
Neutro Abs: 6.7 10*3/uL (ref 1.7–7.7)
Neutrophils Relative %: 65 %
Platelets: 254 10*3/uL (ref 150–400)
RBC: 4.87 MIL/uL (ref 3.87–5.11)
RDW: 13.4 % (ref 11.5–15.5)
WBC: 10.4 10*3/uL (ref 4.0–10.5)
nRBC: 0 % (ref 0.0–0.2)

## 2018-10-08 LAB — COMPREHENSIVE METABOLIC PANEL
ALT: 17 U/L (ref 0–44)
AST: 23 U/L (ref 15–41)
Albumin: 4.4 g/dL (ref 3.5–5.0)
Alkaline Phosphatase: 30 U/L — ABNORMAL LOW (ref 38–126)
Anion gap: 13 (ref 5–15)
BUN: 9 mg/dL (ref 6–20)
CO2: 18 mmol/L — ABNORMAL LOW (ref 22–32)
Calcium: 8.8 mg/dL — ABNORMAL LOW (ref 8.9–10.3)
Chloride: 108 mmol/L (ref 98–111)
Creatinine, Ser: 0.74 mg/dL (ref 0.44–1.00)
GFR calc Af Amer: >60 mL/min (ref >60)
GFR calc non Af Amer: >60 mL/min (ref >60)
Glucose, Bld: 99 mg/dL (ref 70–99)
Potassium: 3.5 mmol/L (ref 3.5–5.1)
Sodium: 139 mmol/L (ref 135–145)
Total Bilirubin: 0.4 mg/dL (ref 0.3–1.2)
Total Protein: 7.7 g/dL (ref 6.5–8.1)

## 2018-10-08 LAB — URINALYSIS, ROUTINE W REFLEX MICROSCOPIC
Bilirubin Urine: NEGATIVE
Glucose, UA: NEGATIVE mg/dL
Ketones, ur: NEGATIVE mg/dL
Leukocytes,Ua: NEGATIVE
Nitrite: NEGATIVE
Protein, ur: NEGATIVE mg/dL
Specific Gravity, Urine: >1.03 — ABNORMAL HIGH (ref 1.005–1.030)
pH: 6 (ref 5.0–8.0)

## 2018-10-08 LAB — URINALYSIS, MICROSCOPIC (REFLEX)

## 2018-10-08 LAB — PREGNANCY, URINE: Preg Test, Ur: NEGATIVE

## 2018-10-08 LAB — LIPASE, BLOOD: Lipase: 44 U/L (ref 11–51)

## 2018-10-08 MED ORDER — PROMETHAZINE HCL 25 MG PO TABS: 25.0000 mg | ORAL_TABLET | Freq: Four times a day (QID) | ORAL | 0 refills | Status: AC | PRN

## 2018-10-08 MED ORDER — SODIUM CHLORIDE 0.9 % IV BOLUS
1000.0000 mL | Freq: Once | INTRAVENOUS | Status: AC
  Administered 2018-10-08: 1000 mL via INTRAVENOUS

## 2018-10-08 MED ORDER — ONDANSETRON HCL 4 MG/2ML IJ SOLN
4.0000 mg | Freq: Once | INTRAMUSCULAR | Status: AC
  Administered 2018-10-08: 4 mg via INTRAVENOUS
  Filled 2018-10-08: qty 2

## 2018-10-08 MED ORDER — PROMETHAZINE HCL 25 MG/ML IJ SOLN
12.5000 mg | Freq: Once | INTRAMUSCULAR | Status: AC
  Administered 2018-10-08: 12.5 mg via INTRAVENOUS
  Filled 2018-10-08: qty 1

## 2018-10-08 MED ORDER — MORPHINE SULFATE (PF) 4 MG/ML IV SOLN
4.0000 mg | Freq: Once | INTRAVENOUS | Status: AC
  Administered 2018-10-08: 4 mg via INTRAVENOUS
  Filled 2018-10-08: qty 1

## 2018-10-08 MED ORDER — OXYCODONE-ACETAMINOPHEN 5-325 MG PO TABS
1.0000 | ORAL_TABLET | Freq: Once | ORAL | Status: AC
  Administered 2018-10-08: 1 via ORAL
  Filled 2018-10-08: qty 1

## 2018-10-08 MED ORDER — PREDNISONE 10 MG (21) PO TBPK: ORAL_TABLET | ORAL | 0 refills | Status: AC

## 2018-10-08 MED ORDER — FENTANYL CITRATE (PF) 100 MCG/2ML IJ SOLN
50.0000 ug | Freq: Once | INTRAMUSCULAR | Status: AC
  Administered 2018-10-08: 50 ug via INTRAVENOUS
  Filled 2018-10-08: qty 2

## 2018-10-08 NOTE — ED Triage Notes (Addendum)
Abd pain, n/v since this am. Feels like a Crohn's flare up . Received her entyvio infusion yesterday but was 2 weeks late usual scheduled time due to COVID-19 restrictions set up by office

## 2018-10-08 NOTE — Discharge Instructions (Addendum)
Take prednisone as prescribed until completed.  Take Phenergan every 6 hours as needed for nausea or vomiting.  Do not combine this with Zofran.  Take 1 or the other.  Make sure to stay well-hydrated.  Please follow-up with your doctor if your symptoms are not improving over the next few days.  Please return to emergency department if you develop any new or worsening symptoms including severe, localized abdominal pain especially in the right lower quadrant, intractable vomiting, fever over 100.4, or any other new or concerning symptoms.

## 2018-10-08 NOTE — ED Provider Notes (Signed)
[facility] EMERGENCY DEPARTMENT Provider Note   CSN: [id number] Arrival date & time: 10/08/18  1121    History   Chief Complaint Chief Complaint  Patient presents with  . Abdominal Pain    HPI Kimberly Caldwell is a 23 y.o. female with history of ulcerative colitis, bipolar disorder, anxiety who presents with abdominal pain, nausea, and vomiting that began around 5 AM this morning.  Patient reports she cannot stop vomiting.  She has had some hematemesis.  She denies any change in her bowel movements and bloody stools.  She has had lower abdominal pain that is described as cramping.  It feels typical of her normal ulcerative colitis flares.  She denies any fever, chest pain, shortness of breath, cough, urinary symptoms, abnormal vaginal bleeding or discharge.  Patient received Entyvio injection yesterday, which was 2 weeks late due to COVID-19 restrictions.     HPI  Past Medical History:  Diagnosis Date  . Anxiety   . Bipolar disorder ([place])   . Colitis, ulcerative ([place]) 2012   ulceration of colon  . Migraine     Patient Active Problem List   Diagnosis Date Noted  . Substance induced mood disorder ([place]) 07/29/2017  . Cannabis abuse 07/15/2011  . Bipolar I disorder ([place])   . Generalized anxiety disorder   . Mood disorder ([place]) 06/17/2011    Past Surgical History:  Procedure Laterality Date  . WISDOM TOOTH EXTRACTION  2007     OB History    Gravida  0   Para  0   Term  0   Preterm  0   AB  0   Living  0     SAB  0   TAB  0   Ectopic  0   Multiple  0   Live Births  0            Home Medications    Prior to Admission medications   Medication Sig Start Date End Date Taking? Authorizing Provider  clonazePAM (KLONOPIN) 0.5 MG tablet Take 0.5-1 mg by mouth daily as needed for anxiety.   Yes [provider]  ESZOPICLONE PO Take by mouth at bedtime as needed for sleep. Take immediately before bedtime    Yes [provider]   folic acid (FOLVITE) 1 MG tablet Take 1 mg by mouth daily. 03/11/18  Yes [provider]  lamoTRIgine (LAMICTAL) 100 MG tablet Take 100 mg by mouth at bedtime.   Yes [provider]  levonorgestrel-ethinyl estradiol (SEASONALE,INTROVALE,JOLESSA) 0.15-0.03 MG tablet Take 1 tablet by mouth daily.   Yes [provider]  lithium carbonate 150 MG capsule Take 300 mg by mouth at bedtime.   Yes [provider]  Lurasidone HCl 60 MG TABS Take 2 tablets (120 mg total) by mouth daily with supper. 07/31/17  Yes Withrow, Elyse Jarvis, FNP  ondansetron (ZOFRAN-ODT) 4 MG disintegrating tablet Take 4 mg by mouth every 8 (eight) hours as needed for nausea/vomiting. 04/01/18  Yes [provider]  sulfaSALAzine (AZULFIDINE) 500 MG tablet Take 500 mg by mouth 4 (four) times daily.   Yes [provider]  vedolizumab (ENTYVIO) 300 MG injection Inject 300 mg into the vein. 300 mg infused every 8 weeks 05/18/16  Yes [provider]  methocarbamol (ROBAXIN) 500 MG tablet Take 1 tablet (500 mg total) by mouth 2 (two) times daily. 08/08/18   Tacy Learn, PA-C  Omega-3 Fatty Acids (FISH OIL PO) Take 1 tablet by mouth daily.  [provider]  predniSONE (STERAPRED UNI-PAK 21 TAB) 10 MG (21) TBPK tablet Take 6 tabs for 2 days, then 5 tabs for 2 days, then 4 tabs for 2 days, then 3 tabs for 2 days, 2 tabs for 2 days, then 1 tab by mouth daily for 2 days 10/08/18   Frederica Kuster, PA-C  promethazine (PHENERGAN) 25 MG tablet Take 1 tablet (25 mg total) by mouth every 6 (six) hours as needed for nausea or vomiting. 10/08/18   Frederica Kuster, PA-C    Family History Family History  Problem Relation Age of Onset  . Alcohol abuse Paternal Grandfather     Social History Social History   Tobacco Use  . Smoking status: Current Every Day Smoker    Packs/day: 0.50    Years: 2.00    Pack years: 1.00  . Smokeless tobacco: Never Used  Substance Use Topics  .  Alcohol use: Yes  . Drug use: No     Allergies   Dilaudid [hydromorphone hcl] and Hydromorphone   Review of Systems Review of Systems  Constitutional: Negative for chills and fever.  HENT: Negative for facial swelling and sore throat.   Respiratory: Negative for shortness of breath.   Cardiovascular: Negative for chest pain.  Gastrointestinal: Positive for abdominal pain, nausea and vomiting. Negative for blood in stool and diarrhea (baseline looser, no change).  Genitourinary: Negative for dysuria.  Musculoskeletal: Negative for back pain.  Skin: Negative for rash and wound.  Neurological: Negative for headaches.  Psychiatric/Behavioral: The patient is not nervous/anxious.      Physical Exam Updated Vital Signs BP (!) 146/93   Pulse (!) 105   Temp 98.3 F (36.8 C) (Oral)   Resp 20   Ht 5' 5"  (1.651 m)   Wt 56.7 kg   LMP 09/11/2018   SpO2 97%   BMI 20.80 kg/m   Physical Exam Vitals signs and nursing note reviewed.  Constitutional:      General: She is not in acute distress.    Appearance: She is well-developed. She is not diaphoretic.  HENT:     Head: Normocephalic and atraumatic.     Mouth/Throat:     Pharynx: No oropharyngeal exudate.  Eyes:     General: No scleral icterus.       Right eye: No discharge.        Left eye: No discharge.     Conjunctiva/sclera: Conjunctivae normal.     Pupils: Pupils are equal, round, and reactive to light.  Neck:     Musculoskeletal: Normal range of motion and neck supple.     Thyroid: No thyromegaly.  Cardiovascular:     Rate and Rhythm: Regular rhythm. Tachycardia present.     Heart sounds: Normal heart sounds. No murmur. No friction rub. No gallop.   Pulmonary:     Effort: Pulmonary effort is normal. No respiratory distress.     Breath sounds: Normal breath sounds. No stridor. No wheezing or rales.  Abdominal:     General: Bowel sounds are normal. There is no distension.     Palpations: Abdomen is soft.      Tenderness: There is abdominal tenderness (generalized, worst LLQ) in the right upper quadrant, right lower quadrant, suprapubic area and left lower quadrant. There is no right CVA tenderness, left CVA tenderness, guarding or rebound.  Lymphadenopathy:     Cervical: No cervical adenopathy.  Skin:    General: Skin is warm and dry.     Coloration: Skin  is not pale.     Findings: No rash.  Neurological:     Mental Status: She is alert.     Coordination: Coordination normal.      ED Treatments / Results  Labs (all labs ordered are listed, but only abnormal results are displayed) Labs Reviewed  URINALYSIS, ROUTINE W REFLEX MICROSCOPIC - Abnormal; Notable for the following components:      Result Value   Specific Gravity, Urine >1.030 (*)    Hgb urine dipstick MODERATE (*)    All other components within normal limits  COMPREHENSIVE METABOLIC PANEL - Abnormal; Notable for the following components:   CO2 18 (*)    Calcium 8.8 (*)    Alkaline Phosphatase 30 (*)    All other components within normal limits  URINALYSIS, MICROSCOPIC (REFLEX) - Abnormal; Notable for the following components:   Bacteria, UA MANY (*)    All other components within normal limits  URINE CULTURE  PREGNANCY, URINE  CBC WITH DIFFERENTIAL/PLATELET  LIPASE, BLOOD    EKG None  Radiology Dg Abdomen Acute W/chest  Result Date: 10/08/2018 CLINICAL DATA:  Nausea and vomiting.  History of ulcerative colitis. EXAM: DG ABDOMEN ACUTE W/ 1V CHEST COMPARISON:  Chest x-ray August 08, 2018 FINDINGS: The chest is normal. No free air, portal venous gas, or pneumatosis. There is a paucity of bowel gas limiting evaluation but no evidence of obstruction. No free air, portal venous gas, or pneumatosis. No other acute abnormalities are identified. IMPRESSION: Negative abdominal radiographs.  No acute cardiopulmonary disease. Electronically Signed   By: Dorise Bullion III M.D   On: 10/08/2018 13:16    Procedures Procedures  (including critical care time)  Medications Ordered in ED Medications  sodium chloride 0.9 % bolus 1,000 mL (0 mLs Intravenous Stopped 10/08/18 1401)  morphine 4 MG/ML injection 4 mg (4 mg Intravenous Given 10/08/18 1200)  promethazine (PHENERGAN) injection 12.5 mg (12.5 mg Intravenous Given 10/08/18 1203)  fentaNYL (SUBLIMAZE) injection 50 mcg (50 mcg Intravenous Given 10/08/18 1253)  ondansetron (ZOFRAN) injection 4 mg (4 mg Intravenous Given 10/08/18 1327)  oxyCODONE-acetaminophen (PERCOCET/ROXICET) 5-325 MG per tablet 1 tablet (1 tablet Oral Given 10/08/18 1401)     Initial Impression / Assessment and Plan / ED Course  I have reviewed the triage vital signs and the nursing notes.  Pertinent labs & imaging results that were available during my care of the patient were reviewed by me and considered in my medical decision making (see chart for details).        Patient presenting with lower abdominal pain, nausea, vomiting in the setting of ulcerative colitis.  Patient feels like it is typical of her ulcerative colitis flares.  Labs are unremarkable.  Acute abdominal series is negative.  Patient is feeling much better after IV fluids, Phenergan, and fentanyl.  Patient also given single Percocet prior to discharge.  Patient will be discharged home with Phenergan, as the Zofran that she is taking at home was not helping much.  Patient also discharged home with prednisone taper.  Follow-up to PCP as needed.  Strict return precautions given including localized abdominal pain, especially to the right lower quadrant, fever over 100.4, or any other new or concerning symptoms.  She understands and agrees with plan.  Patient vital stable throughout ED course and discharged in satisfactory condition. I discussed patient case with Dr. Tamera Punt who guided the patient's management and agrees with plan.   Final Clinical Impressions(s) / ED Diagnoses   Final diagnoses:  Lower  abdominal pain  Non-intractable  vomiting with nausea, unspecified vomiting type    ED Discharge Orders         Ordered    predniSONE (STERAPRED UNI-PAK 21 TAB) 10 MG (21) TBPK tablet     10/08/18 1400    promethazine (PHENERGAN) 25 MG tablet  Every 6 hours PRN     10/08/18 [address], PA-C 10/08/18 1542    Malvin Johns, MD 10/09/18 [phone]

## 2018-10-09 LAB — URINE CULTURE: Culture: NO GROWTH

## 2018-12-01 DIAGNOSIS — K5100 Ulcerative (chronic) pancolitis without complications: Secondary | ICD-10-CM | POA: Diagnosis not present

## 2019-01-13 DIAGNOSIS — F3170 Bipolar disorder, currently in remission, most recent episode unspecified: Secondary | ICD-10-CM | POA: Diagnosis not present

## 2019-01-26 DIAGNOSIS — Z79899 Other long term (current) drug therapy: Secondary | ICD-10-CM | POA: Diagnosis not present

## 2019-01-26 DIAGNOSIS — K5100 Ulcerative (chronic) pancolitis without complications: Secondary | ICD-10-CM | POA: Diagnosis not present

## 2019-01-27 DIAGNOSIS — F3170 Bipolar disorder, currently in remission, most recent episode unspecified: Secondary | ICD-10-CM | POA: Diagnosis not present

## 2019-02-10 DIAGNOSIS — F3170 Bipolar disorder, currently in remission, most recent episode unspecified: Secondary | ICD-10-CM | POA: Diagnosis not present

## 2019-02-24 DIAGNOSIS — F3170 Bipolar disorder, currently in remission, most recent episode unspecified: Secondary | ICD-10-CM | POA: Diagnosis not present

## 2019-03-15 DIAGNOSIS — R509 Fever, unspecified: Secondary | ICD-10-CM | POA: Diagnosis not present

## 2019-03-17 DIAGNOSIS — F3170 Bipolar disorder, currently in remission, most recent episode unspecified: Secondary | ICD-10-CM | POA: Diagnosis not present

## 2019-03-23 DIAGNOSIS — Z23 Encounter for immunization: Secondary | ICD-10-CM | POA: Diagnosis not present

## 2019-03-23 DIAGNOSIS — K5100 Ulcerative (chronic) pancolitis without complications: Secondary | ICD-10-CM | POA: Diagnosis not present

## 2019-03-31 DIAGNOSIS — F3170 Bipolar disorder, currently in remission, most recent episode unspecified: Secondary | ICD-10-CM | POA: Diagnosis not present

## 2019-05-19 DIAGNOSIS — F3170 Bipolar disorder, currently in remission, most recent episode unspecified: Secondary | ICD-10-CM | POA: Diagnosis not present

## 2019-05-24 DIAGNOSIS — K5100 Ulcerative (chronic) pancolitis without complications: Secondary | ICD-10-CM | POA: Diagnosis not present

## 2019-06-17 DIAGNOSIS — F3170 Bipolar disorder, currently in remission, most recent episode unspecified: Secondary | ICD-10-CM | POA: Diagnosis not present

## 2019-07-11 DIAGNOSIS — Z3041 Encounter for surveillance of contraceptive pills: Secondary | ICD-10-CM | POA: Diagnosis not present

## 2019-07-19 DIAGNOSIS — K5100 Ulcerative (chronic) pancolitis without complications: Secondary | ICD-10-CM | POA: Diagnosis not present

## 2019-07-25 DIAGNOSIS — M542 Cervicalgia: Secondary | ICD-10-CM | POA: Diagnosis not present

## 2019-07-25 DIAGNOSIS — M5413 Radiculopathy, cervicothoracic region: Secondary | ICD-10-CM | POA: Diagnosis not present

## 2019-07-25 DIAGNOSIS — M9901 Segmental and somatic dysfunction of cervical region: Secondary | ICD-10-CM | POA: Diagnosis not present

## 2019-07-25 DIAGNOSIS — M9902 Segmental and somatic dysfunction of thoracic region: Secondary | ICD-10-CM | POA: Diagnosis not present

## 2019-08-11 DIAGNOSIS — F332 Major depressive disorder, recurrent severe without psychotic features: Secondary | ICD-10-CM | POA: Diagnosis not present

## 2019-08-11 DIAGNOSIS — R6889 Other general symptoms and signs: Secondary | ICD-10-CM | POA: Diagnosis not present

## 2019-08-11 DIAGNOSIS — F3170 Bipolar disorder, currently in remission, most recent episode unspecified: Secondary | ICD-10-CM | POA: Diagnosis not present

## 2019-08-11 DIAGNOSIS — E559 Vitamin D deficiency, unspecified: Secondary | ICD-10-CM | POA: Diagnosis not present

## 2019-08-11 DIAGNOSIS — Z79899 Other long term (current) drug therapy: Secondary | ICD-10-CM | POA: Diagnosis not present

## 2019-08-11 DIAGNOSIS — R7309 Other abnormal glucose: Secondary | ICD-10-CM | POA: Diagnosis not present

## 2019-12-31 ENCOUNTER — Emergency Department (HOSPITAL_BASED_OUTPATIENT_CLINIC_OR_DEPARTMENT_OTHER)
Admission: EM | Admit: 2019-12-31 | Discharge: 2019-12-31 | Disposition: A | Discharge status: Home / Self Care | Payer: BLUE CROSS/BLUE SHIELD | Attending: Emergency Medicine | Admitting: Emergency Medicine

## 2019-12-31 ENCOUNTER — Encounter (HOSPITAL_BASED_OUTPATIENT_CLINIC_OR_DEPARTMENT_OTHER): Payer: Self-pay

## 2019-12-31 ENCOUNTER — Other Ambulatory Visit: Payer: Self-pay

## 2019-12-31 DIAGNOSIS — R112 Nausea with vomiting, unspecified: Secondary | ICD-10-CM | POA: Diagnosis not present

## 2019-12-31 DIAGNOSIS — F17200 Nicotine dependence, unspecified, uncomplicated: Secondary | ICD-10-CM | POA: Insufficient documentation

## 2019-12-31 DIAGNOSIS — Z79899 Other long term (current) drug therapy: Secondary | ICD-10-CM | POA: Diagnosis not present

## 2019-12-31 DIAGNOSIS — F1099 Alcohol use, unspecified with unspecified alcohol-induced disorder: Secondary | ICD-10-CM | POA: Diagnosis not present

## 2019-12-31 DIAGNOSIS — Z885 Allergy status to narcotic agent status: Secondary | ICD-10-CM | POA: Insufficient documentation

## 2019-12-31 LAB — COMPREHENSIVE METABOLIC PANEL
ALT: 15 U/L (ref 0–44)
AST: 18 U/L (ref 15–41)
Albumin: 4 g/dL (ref 3.5–5.0)
Alkaline Phosphatase: 35 U/L — ABNORMAL LOW (ref 38–126)
Anion gap: 7 (ref 5–15)
BUN: 8 mg/dL (ref 6–20)
CO2: 25 mmol/L (ref 22–32)
Calcium: 8.7 mg/dL — ABNORMAL LOW (ref 8.9–10.3)
Chloride: 104 mmol/L (ref 98–111)
Creatinine, Ser: 0.95 mg/dL (ref 0.44–1.00)
GFR calc Af Amer: >60 mL/min (ref >60)
GFR calc non Af Amer: >60 mL/min (ref >60)
Glucose, Bld: 109 mg/dL — ABNORMAL HIGH (ref 70–99)
Potassium: 3.9 mmol/L (ref 3.5–5.1)
Sodium: 136 mmol/L (ref 135–145)
Total Bilirubin: 0.6 mg/dL (ref 0.3–1.2)
Total Protein: 6.6 g/dL (ref 6.5–8.1)

## 2019-12-31 LAB — CBC WITH DIFFERENTIAL/PLATELET
Abs Immature Granulocytes: 0.02 10*3/uL (ref 0.00–0.07)
Basophils Absolute: 0 10*3/uL (ref 0.0–0.1)
Basophils Relative: 1 %
Eosinophils Absolute: 0.2 10*3/uL (ref 0.0–0.5)
Eosinophils Relative: 4 %
HCT: 44.1 % (ref 36.0–46.0)
Hemoglobin: 14.4 g/dL (ref 12.0–15.0)
Immature Granulocytes: 1 %
Lymphocytes Relative: 14 %
Lymphs Abs: 0.6 10*3/uL — ABNORMAL LOW (ref 0.7–4.0)
MCH: 30.9 pg (ref 26.0–34.0)
MCHC: 32.7 g/dL (ref 30.0–36.0)
MCV: 94.6 fL (ref 80.0–100.0)
Monocytes Absolute: 0.5 10*3/uL (ref 0.1–1.0)
Monocytes Relative: 12 %
Neutro Abs: 2.9 10*3/uL (ref 1.7–7.7)
Neutrophils Relative %: 68 %
Platelets: 217 10*3/uL (ref 150–400)
RBC: 4.66 MIL/uL (ref 3.87–5.11)
RDW: 13.2 % (ref 11.5–15.5)
WBC: 4.2 10*3/uL (ref 4.0–10.5)
nRBC: 0 % (ref 0.0–0.2)

## 2019-12-31 MED ORDER — SODIUM CHLORIDE 0.9 % IV BOLUS (SEPSIS)
1000.0000 mL | Freq: Once | INTRAVENOUS | Status: AC
  Administered 2019-12-31: 1000 mL via INTRAVENOUS

## 2019-12-31 MED ORDER — METOCLOPRAMIDE HCL 5 MG/ML IJ SOLN
10.0000 mg | Freq: Once | INTRAMUSCULAR | Status: AC
  Administered 2019-12-31: 10 mg via INTRAVENOUS
  Filled 2019-12-31: qty 2

## 2019-12-31 MED ORDER — DIPHENHYDRAMINE HCL 50 MG/ML IJ SOLN
12.5000 mg | Freq: Once | INTRAMUSCULAR | Status: AC
  Administered 2019-12-31: 12.5 mg via INTRAVENOUS
  Filled 2019-12-31: qty 1

## 2019-12-31 MED ORDER — SODIUM CHLORIDE 0.9 % IV SOLN: 1000.0000 mL | INTRAVENOUS | Status: DC

## 2019-12-31 NOTE — ED Triage Notes (Signed)
Pt presents with 5 day hx of emesis and not being able to keep down solid food. Pt has tried Zofran with little relief. Pt has hx of ulcerative colitis and is in the process of scheduling endoscopy. Pt having liquid stool and is able to pass gas.

## 2019-12-31 NOTE — ED Notes (Signed)
ED Provider at bedside. 

## 2019-12-31 NOTE — ED Provider Notes (Signed)
[facility] EMERGENCY DEPARTMENT Provider Note   CSN: [id number] Arrival date & time: 12/31/19  1309     History Chief Complaint  Patient presents with  . Emesis    Kimberly Caldwell is a 24 y.o. female.  The history is provided by the patient. No language interpreter was used.  Emesis Severity:  Severe Duration:  5 days Timing:  Constant Progression:  Worsening Chronicity:  New Relieved by:  Nothing Worsened by:  Nothing Ineffective treatments:  None tried Associated symptoms: no abdominal pain   Risk factors: no suspect food intake    Pt complains of vomiting for the past 5 days.  Pt has a history of chron's disease.  Pt reports no relief with zofran at home.      Past Medical History:  Diagnosis Date  . Anxiety   . Bipolar disorder ([place])   . Colitis, ulcerative ([place]) 2012   ulceration of colon  . Migraine     Patient Active Problem List   Diagnosis Date Noted  . Substance induced mood disorder ([place]) 07/29/2017  . Cannabis abuse 07/15/2011  . Bipolar I disorder ([place])   . Generalized anxiety disorder   . Mood disorder ([place]) 06/17/2011    Past Surgical History:  Procedure Laterality Date  . WISDOM TOOTH EXTRACTION  2007     OB History    Gravida  0   Para  0   Term  0   Preterm  0   AB  0   Living  0     SAB  0   TAB  0   Ectopic  0   Multiple  0   Live Births  0           Family History  Problem Relation Age of Onset  . Alcohol abuse Paternal Grandfather     Social History   Tobacco Use  . Smoking status: Current Every Day Smoker    Packs/day: 0.50    Years: 2.00    Pack years: 1.00  . Smokeless tobacco: Never Used  Vaping Use  . Vaping Use: Never used  Substance Use Topics  . Alcohol use: Yes  . Drug use: No    Home Medications Prior to Admission medications   Medication Sig Start Date End Date Taking? Authorizing Provider  clonazePAM (KLONOPIN) 0.5 MG tablet Take 0.5-1 mg by mouth daily as needed for  anxiety.    [provider]  ESZOPICLONE PO Take by mouth at bedtime as needed for sleep. Take immediately before bedtime     [provider]  folic acid (FOLVITE) 1 MG tablet Take 1 mg by mouth daily. 03/11/18   [provider]  lamoTRIgine (LAMICTAL) 100 MG tablet Take 100 mg by mouth at bedtime.    [provider]  levonorgestrel-ethinyl estradiol (SEASONALE,INTROVALE,JOLESSA) 0.15-0.03 MG tablet Take 1 tablet by mouth daily.    [provider]  lithium carbonate 150 MG capsule Take 300 mg by mouth at bedtime.    [provider]  Lurasidone HCl 60 MG TABS Take 2 tablets (120 mg total) by mouth daily with supper. 07/31/17   Withrow, Elyse Jarvis, FNP  methocarbamol (ROBAXIN) 500 MG tablet Take 1 tablet (500 mg total) by mouth 2 (two) times daily. 08/08/18   Tacy Learn, PA-C  Omega-3 Fatty Acids (FISH OIL PO) Take 1 tablet by mouth daily.    [provider]  ondansetron (ZOFRAN-ODT) 4 MG disintegrating tablet Take 4 mg by mouth every  8 (eight) hours as needed for nausea/vomiting. 04/01/18   [provider]  predniSONE (STERAPRED UNI-PAK 21 TAB) 10 MG (21) TBPK tablet Take 6 tabs for 2 days, then 5 tabs for 2 days, then 4 tabs for 2 days, then 3 tabs for 2 days, 2 tabs for 2 days, then 1 tab by mouth daily for 2 days 10/08/18   Frederica Kuster, PA-C  promethazine (PHENERGAN) 25 MG tablet Take 1 tablet (25 mg total) by mouth every 6 (six) hours as needed for nausea or vomiting. 10/08/18   Law, Bea Graff, PA-C  sulfaSALAzine (AZULFIDINE) 500 MG tablet Take 500 mg by mouth 4 (four) times daily.    [provider]  vedolizumab (ENTYVIO) 300 MG injection Inject 300 mg into the vein. 300 mg infused every 8 weeks 05/18/16   [provider]    Allergies    Dilaudid [hydromorphone hcl] and Hydromorphone  Review of Systems   Review of Systems  Gastrointestinal: Positive for vomiting. Negative for abdominal pain.  All  other systems reviewed and are negative.   Physical Exam Updated Vital Signs BP 100/61   Pulse 71   Temp 98 F (36.7 C)   Resp 18   Ht 5' 5"  (1.651 m)   Wt 65.8 kg   LMP 12/12/2019   SpO2 100%   BMI 24.13 kg/m   Physical Exam Vitals and nursing note reviewed.  Constitutional:      Appearance: She is well-developed.  HENT:     Head: Normocephalic.  Cardiovascular:     Rate and Rhythm: Normal rate.  Pulmonary:     Effort: Pulmonary effort is normal.  Abdominal:     General: Abdomen is flat. There is no distension.     Palpations: Abdomen is soft.  Musculoskeletal:        General: Normal range of motion.     Cervical back: Normal range of motion.  Skin:    General: Skin is warm.  Neurological:     Mental Status: She is alert and oriented to person, place, and time.  Psychiatric:        Mood and Affect: Mood normal.     ED Results / Procedures / Treatments   Labs (all labs ordered are listed, but only abnormal results are displayed) Labs Reviewed  CBC WITH DIFFERENTIAL/PLATELET - Abnormal; Notable for the following components:      Result Value   Lymphs Abs 0.6 (*)    All other components within normal limits  COMPREHENSIVE METABOLIC PANEL - Abnormal; Notable for the following components:   Glucose, Bld 109 (*)    Calcium 8.7 (*)    Alkaline Phosphatase 35 (*)    All other components within normal limits    EKG None  Radiology No results found.  Procedures Procedures (including critical care time)  Medications Ordered in ED Medications  sodium chloride 0.9 % bolus 1,000 mL (0 mLs Intravenous Stopped 12/31/19 1554)    Followed by  sodium chloride 0.9 % bolus 1,000 mL (0 mLs Intravenous Stopped 12/31/19 1554)    Followed by  0.9 %  sodium chloride infusion (has no administration in time range)  metoCLOPramide (REGLAN) injection 10 mg (10 mg Intravenous Given 12/31/19 1411)  diphenhydrAMINE (BENADRYL) injection 12.5 mg (12.5 mg Intravenous Given 12/31/19  1411)    ED Course  I have reviewed the triage vital signs and the nursing notes.  Pertinent labs & imaging results that were available during my care of the patient were  reviewed by me and considered in my medical decision making (see chart for details).    MDM Rules/Calculators/A&P                          MDM:  Pt given Iv fluids x 2 liters.  Pt reports she feels better.  Labs normal.   Final Clinical Impression(s) / ED Diagnoses Final diagnoses:  Nausea and vomiting, intractability of vomiting not specified, unspecified vomiting type    Rx / DC Orders ED Discharge Orders    None    An After Visit Summary was printed and given to the patient.    Fransico Meadow, PA-C 12/31/19 [place], [place], DO 01/03/20 [phone]

## 2019-12-31 NOTE — Discharge Instructions (Addendum)
Follow up with your Gi doctor for recheck

## 2020-05-05 IMAGING — CR DG ABDOMEN ACUTE W/ 1V CHEST
3 series · 3 of 3 positions shown · non-contrast
Comparison: Chest x-ray August 08, 2018

CLINICAL DATA: Nausea and vomiting.  History of ulcerative colitis.

EXAM:
DG ABDOMEN ACUTE W/ 1V CHEST

[w chest pa]
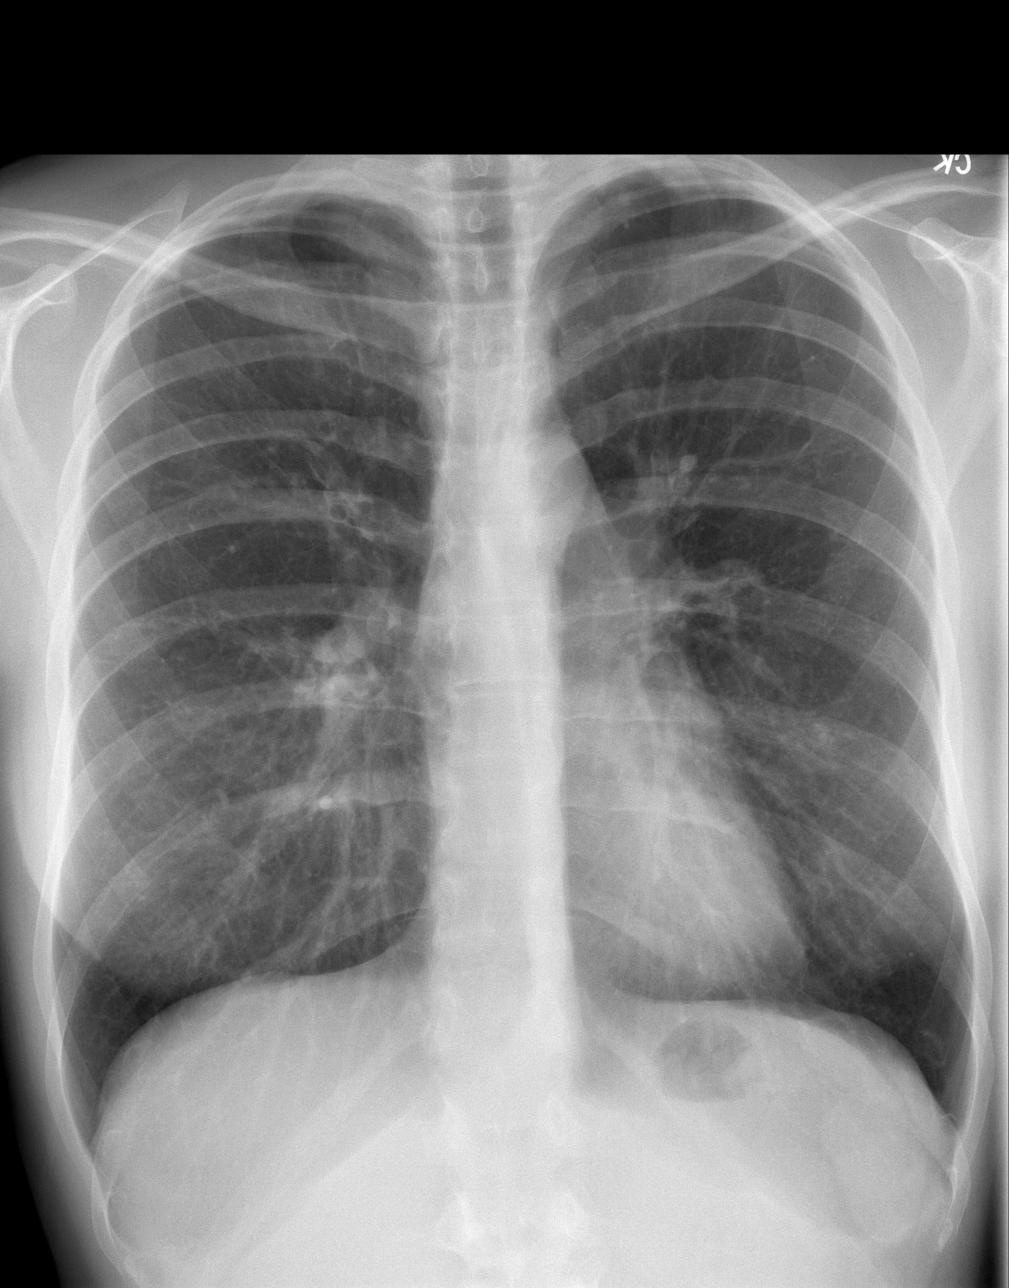

[w abdomen upright]
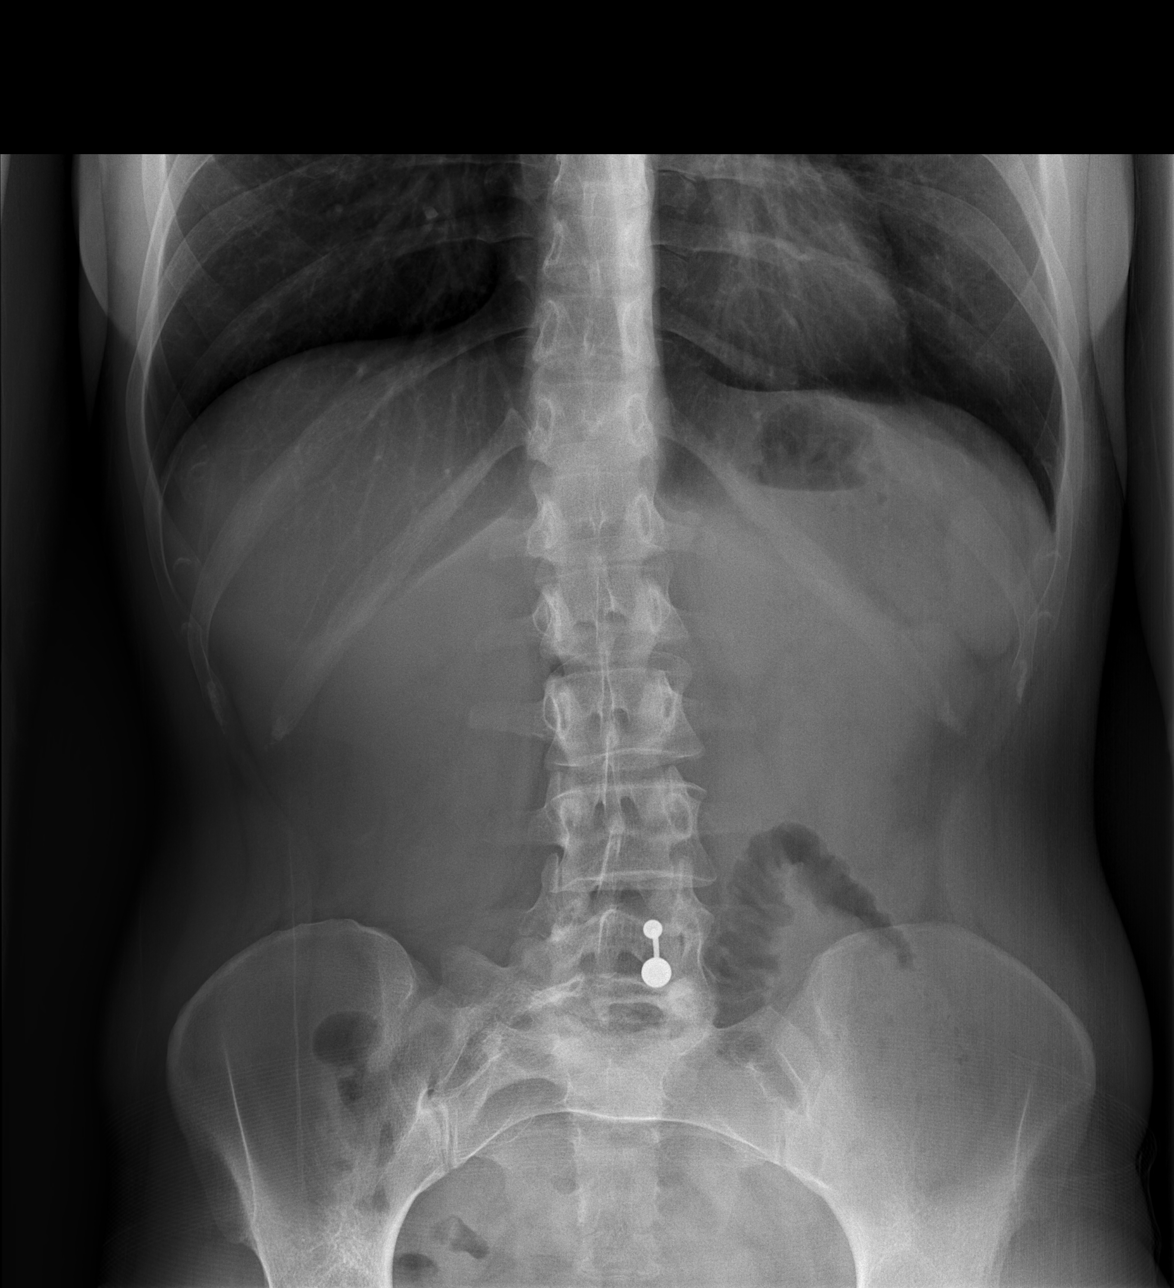

[t abdomen supine]
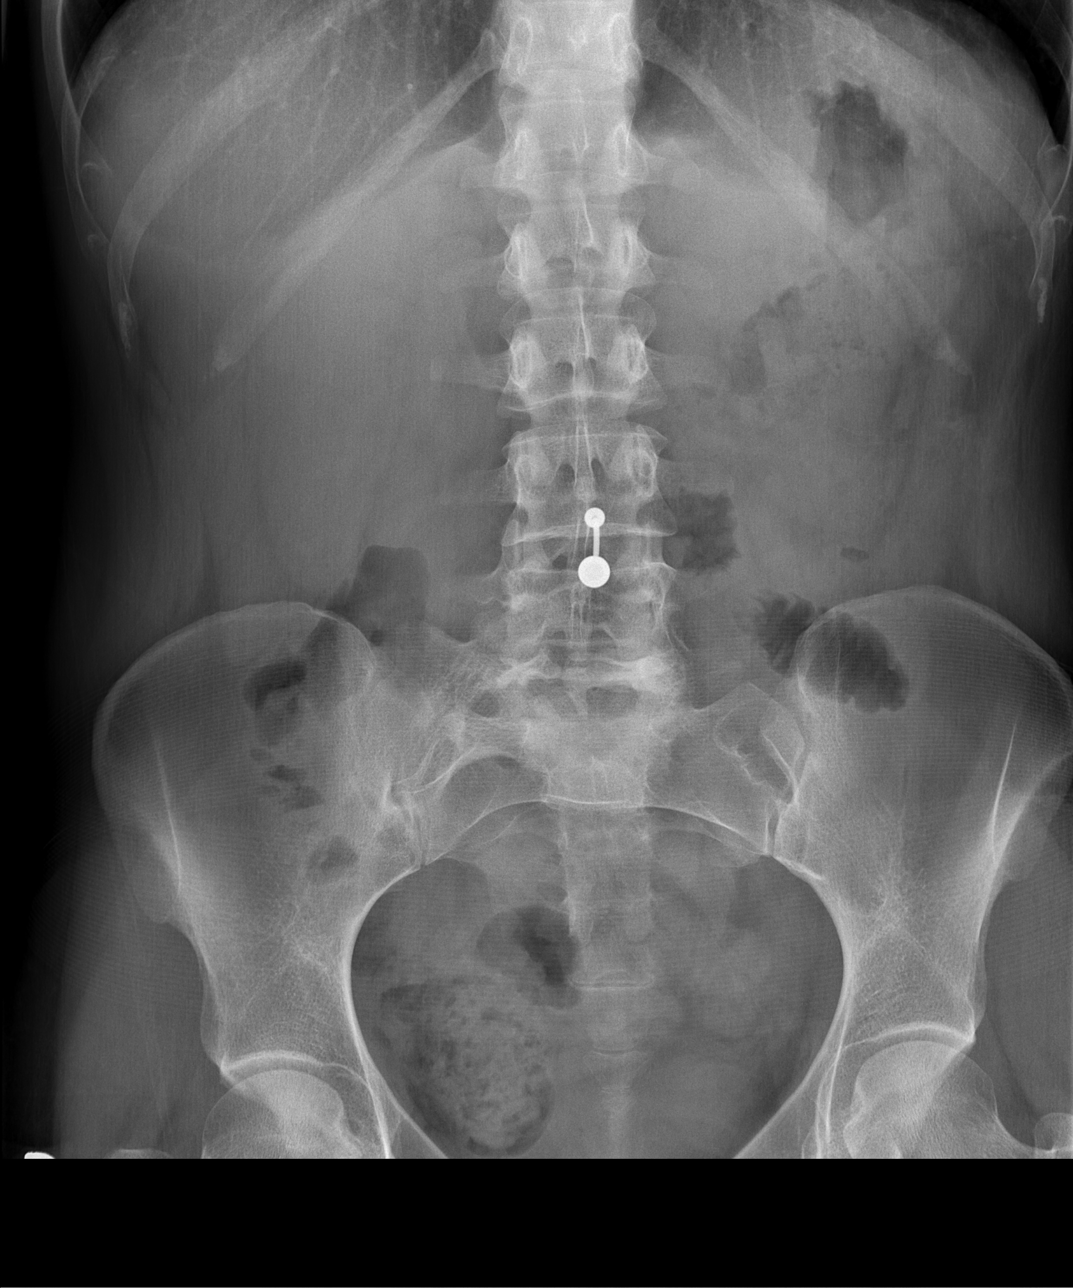

[3 of 3 positions shown; findings below may reference images not displayed]

FINDINGS: The chest is normal.

No free air, portal venous gas, or pneumatosis. There is a paucity
of bowel gas limiting evaluation but no evidence of obstruction. No
free air, portal venous gas, or pneumatosis. No other acute
abnormalities are identified.
IMPRESSION: Negative abdominal radiographs.  No acute cardiopulmonary disease.

## 2022-01-15 ENCOUNTER — Encounter (HOSPITAL_BASED_OUTPATIENT_CLINIC_OR_DEPARTMENT_OTHER): Payer: Self-pay

## 2022-01-15 ENCOUNTER — Other Ambulatory Visit: Payer: Self-pay

## 2022-01-15 ENCOUNTER — Emergency Department (HOSPITAL_BASED_OUTPATIENT_CLINIC_OR_DEPARTMENT_OTHER)
Admission: EM | Admit: 2022-01-15 | Discharge: 2022-01-16 | Disposition: A | Discharge status: Home / Self Care | Payer: Managed Care, Other (non HMO) | Attending: Emergency Medicine | Admitting: Emergency Medicine

## 2022-01-15 DIAGNOSIS — H1031 Unspecified acute conjunctivitis, right eye: Secondary | ICD-10-CM | POA: Diagnosis not present

## 2022-01-15 DIAGNOSIS — B9689 Other specified bacterial agents as the cause of diseases classified elsewhere: Secondary | ICD-10-CM | POA: Insufficient documentation

## 2022-01-15 DIAGNOSIS — H5789 Other specified disorders of eye and adnexa: Secondary | ICD-10-CM | POA: Diagnosis present

## 2022-01-15 NOTE — ED Triage Notes (Signed)
Pt states that she slept in her makeup last night and now feels like she has something stuck in her R eye.

## 2022-01-16 MED ORDER — POLYMYXIN B-TRIMETHOPRIM 10000-0.1 UNIT/ML-% OP SOLN: 1.0000 [drp] | OPHTHALMIC | 0 refills | Status: DC

## 2022-01-16 MED ORDER — POLYMYXIN B-TRIMETHOPRIM 10000-0.1 UNIT/ML-% OP SOLN: 1.0000 [drp] | OPHTHALMIC | 0 refills | Status: AC

## 2022-01-16 MED ORDER — TETRACAINE HCL 0.5 % OP SOLN
1.0000 [drp] | Freq: Once | OPHTHALMIC | Status: AC
  Administered 2022-01-16: 1 [drp] via OPHTHALMIC
  Filled 2022-01-16: qty 4

## 2022-01-16 NOTE — Discharge Instructions (Signed)
Please follow-up with your eye doctor in the office.

## 2022-01-16 NOTE — ED Provider Notes (Signed)
[facility] EMERGENCY DEPARTMENT Provider Note   CSN: [id number] Arrival date & time: 01/15/22  2231     History  Chief Complaint  Patient presents with   Foreign Body in [place] is a 26 y.o. female.  26 yo F with a chief complaint of right eye redness and drainage.  This has been going on since this morning.  Last night she had slept with her eye make-up on and woke up with this complaint.  She denies being around anyone that sick.  She is currently pregnant.  She denies any change in vision.  Feels like something could be in her eye.   Foreign Body in [place] Medications Prior to Admission medications   Medication Sig Start Date End Date Taking? Authorizing Provider  clonazePAM (KLONOPIN) 0.5 MG tablet Take 0.5-1 mg by mouth daily as needed for anxiety.    [provider]  ESZOPICLONE PO Take by mouth at bedtime as needed for sleep. Take immediately before bedtime     [provider]  folic acid (FOLVITE) 1 MG tablet Take 1 mg by mouth daily. 03/11/18   [provider]  lamoTRIgine (LAMICTAL) 100 MG tablet Take 100 mg by mouth at bedtime.    [provider]  levonorgestrel-ethinyl estradiol (SEASONALE,INTROVALE,JOLESSA) 0.15-0.03 MG tablet Take 1 tablet by mouth daily.    [provider]  lithium carbonate 150 MG capsule Take 300 mg by mouth at bedtime.    [provider]  Lurasidone HCl 60 MG TABS Take 2 tablets (120 mg total) by mouth daily with supper. 07/31/17   Withrow, Elyse Jarvis, FNP  methocarbamol (ROBAXIN) 500 MG tablet Take 1 tablet (500 mg total) by mouth 2 (two) times daily. 08/08/18   Tacy Learn, PA-C  Omega-3 Fatty Acids (FISH OIL PO) Take 1 tablet by mouth daily.    [provider]  ondansetron (ZOFRAN-ODT) 4 MG disintegrating tablet Take 4 mg by mouth every 8 (eight) hours as needed for nausea/vomiting. 04/01/18   [provider]  predniSONE (STERAPRED UNI-PAK 21  TAB) 10 MG (21) TBPK tablet Take 6 tabs for 2 days, then 5 tabs for 2 days, then 4 tabs for 2 days, then 3 tabs for 2 days, 2 tabs for 2 days, then 1 tab by mouth daily for 2 days 10/08/18   Frederica Kuster, PA-C  promethazine (PHENERGAN) 25 MG tablet Take 1 tablet (25 mg total) by mouth every 6 (six) hours as needed for nausea or vomiting. 10/08/18   Law, Bea Graff, PA-C  sulfaSALAzine (AZULFIDINE) 500 MG tablet Take 500 mg by mouth 4 (four) times daily.    [provider]  trimethoprim-polymyxin b (POLYTRIM) ophthalmic solution Place 1 drop into the right eye every 4 (four) hours. 01/16/22   Deno Etienne, DO  vedolizumab (ENTYVIO) 300 MG injection Inject 300 mg into the vein. 300 mg infused every 8 weeks 05/18/16   [provider]      Allergies    Dilaudid [hydromorphone hcl] and Hydromorphone    Review of Systems   Review of Systems  Physical Exam Updated Vital Signs BP 118/68 (BP Location: Right Arm)   Pulse 83   Temp 98 F (36.7 C) (Oral)   Resp 18   Ht 5' 5"  (1.651 m)   Wt 74.8 kg   SpO2 99%   BMI 27.46 kg/m  Physical Exam Vitals and nursing note reviewed.  Constitutional:  General: She is not in acute distress.    Appearance: She is well-developed. She is not diaphoretic.  HENT:     Head: Normocephalic and atraumatic.  Eyes:     Pupils: Pupils are equal, round, and reactive to light.     Comments: Right-sided injection with purulent drainage.  No obvious foreign body.  Cardiovascular:     Rate and Rhythm: Normal rate and regular rhythm.     Heart sounds: No murmur heard.    No friction rub. No gallop.  Pulmonary:     Effort: Pulmonary effort is normal.     Breath sounds: No wheezing or rales.  Abdominal:     General: There is no distension.     Palpations: Abdomen is soft.     Tenderness: There is no abdominal tenderness.  Musculoskeletal:        General: No tenderness.     Cervical back: Normal range of motion and neck supple.  Skin:     General: Skin is warm and dry.  Neurological:     Mental Status: She is alert and oriented to person, place, and time.  Psychiatric:        Behavior: Behavior normal.     ED Results / Procedures / Treatments   Labs (all labs ordered are listed, but only abnormal results are displayed) Labs Reviewed - No data to display  EKG None  Radiology No results found.  Procedures Procedures    Medications Ordered in ED Medications  tetracaine (PONTOCAINE) 0.5 % ophthalmic solution 1 drop (1 drop Right Eye Given 01/16/22 0058)    ED Course/ Medical Decision Making/ A&P                           Medical Decision Making Risk Prescription drug management.   26 yo F with a chief complaints of right eye redness and drainage.  Drainage appears to be purulent.  We will treat as bacterial conjunctivitis.  Other possibility is allergic conjunctivitis from her make-up that was left on last night.  Have her follow-up with her ophthalmologist in the office.  2:18 AM:  I have discussed the diagnosis/risks/treatment options with the patient and family.  Evaluation and diagnostic testing in the emergency department does not suggest an emergent condition requiring admission or immediate intervention beyond what has been performed at this time.  They will follow up with  Ophtho. We also discussed returning to the ED immediately if new or worsening sx occur. We discussed the sx which are most concerning (e.g., sudden worsening pain, fever, inability to tolerate by mouth) that necessitate immediate return. Medications administered to the patient during their visit and any new prescriptions provided to the patient are listed below.  Medications given during this visit Medications  tetracaine (PONTOCAINE) 0.5 % ophthalmic solution 1 drop (1 drop Right Eye Given 01/16/22 0058)     The patient appears reasonably screen and/or stabilized for discharge and I doubt any other medical condition or other [facility]  requiring further screening, evaluation, or treatment in the ED at this time prior to discharge.          Final Clinical Impression(s) / ED Diagnoses Final diagnoses:  Acute bacterial conjunctivitis of right eye    Rx / DC Orders ED Discharge Orders          Ordered    trimethoprim-polymyxin b (POLYTRIM) ophthalmic solution  Every 4 hours,   Status:  Discontinued  01/16/22 0055    trimethoprim-polymyxin b (POLYTRIM) ophthalmic solution  Every 4 hours        01/16/22 0130              Deno Etienne, DO 01/16/22 8675
